# Patient Record
Sex: Female | Born: 1960
Health system: Southern US, Community
[De-identification: ages and names within clinical notes are randomized; demographics above are authoritative.]

## PROBLEM LIST (undated history)

## (undated) DIAGNOSIS — R002 Palpitations: Secondary | ICD-10-CM

## (undated) DIAGNOSIS — H579 Unspecified disorder of eye and adnexa: Secondary | ICD-10-CM

## (undated) DIAGNOSIS — E669 Obesity, unspecified: Secondary | ICD-10-CM

## (undated) DIAGNOSIS — G510 Bell's palsy: Secondary | ICD-10-CM

## (undated) DIAGNOSIS — H3500 Unspecified background retinopathy: Secondary | ICD-10-CM

## (undated) DIAGNOSIS — R531 Weakness: Secondary | ICD-10-CM

## (undated) HISTORY — DX: Obesity, unspecified: E66.9

## (undated) HISTORY — DX: Unspecified disorder of eye and adnexa: H57.9

## (undated) HISTORY — DX: Palpitations: R00.2

## (undated) HISTORY — DX: Unspecified background retinopathy: H35.00

## (undated) HISTORY — DX: Weakness: R53.1

## (undated) HISTORY — PX: GALLBLADDER SURGERY: SHX652

## (undated) HISTORY — DX: Bell's palsy: G51.0

---

## 1994-11-08 HISTORY — PX: ABDOMINAL HYSTERECTOMY: SHX81

## 2001-11-14 ENCOUNTER — Ambulatory Visit (HOSPITAL_COMMUNITY): Admission: RE | Admit: 2001-11-14 | Discharge: 2001-11-14 | Payer: Self-pay | Admitting: *Deleted

## 2001-11-14 ENCOUNTER — Encounter: Payer: Self-pay | Admitting: *Deleted

## 2002-02-14 ENCOUNTER — Encounter: Payer: Self-pay | Admitting: *Deleted

## 2002-02-14 ENCOUNTER — Ambulatory Visit (HOSPITAL_COMMUNITY): Admission: RE | Admit: 2002-02-14 | Discharge: 2002-02-14 | Payer: Self-pay | Admitting: *Deleted

## 2002-05-02 ENCOUNTER — Ambulatory Visit (HOSPITAL_COMMUNITY): Admission: RE | Admit: 2002-05-02 | Discharge: 2002-05-02 | Payer: Self-pay | Admitting: *Deleted

## 2002-05-02 ENCOUNTER — Encounter: Payer: Self-pay | Admitting: *Deleted

## 2002-09-01 ENCOUNTER — Encounter: Payer: Self-pay | Admitting: Family Medicine

## 2002-09-01 ENCOUNTER — Ambulatory Visit (HOSPITAL_COMMUNITY): Admission: RE | Admit: 2002-09-01 | Discharge: 2002-09-01 | Payer: Self-pay | Admitting: Family Medicine

## 2002-09-21 ENCOUNTER — Ambulatory Visit (HOSPITAL_COMMUNITY): Admission: RE | Admit: 2002-09-21 | Discharge: 2002-09-21 | Payer: Self-pay | Admitting: General Surgery

## 2002-10-11 ENCOUNTER — Ambulatory Visit (HOSPITAL_COMMUNITY): Admission: RE | Admit: 2002-10-11 | Discharge: 2002-10-11 | Payer: Self-pay | Admitting: Internal Medicine

## 2003-06-24 ENCOUNTER — Encounter: Payer: Self-pay | Admitting: Family Medicine

## 2003-06-24 ENCOUNTER — Ambulatory Visit (HOSPITAL_COMMUNITY): Admission: RE | Admit: 2003-06-24 | Discharge: 2003-06-24 | Payer: Self-pay | Admitting: Family Medicine

## 2007-02-01 ENCOUNTER — Other Ambulatory Visit: Admission: RE | Admit: 2007-02-01 | Discharge: 2007-02-01 | Payer: Self-pay | Admitting: Obstetrics and Gynecology

## 2007-03-01 ENCOUNTER — Encounter: Admission: RE | Admit: 2007-03-01 | Discharge: 2007-03-01 | Payer: Self-pay | Admitting: Obstetrics and Gynecology

## 2007-06-21 ENCOUNTER — Encounter (HOSPITAL_COMMUNITY): Admission: RE | Admit: 2007-06-21 | Discharge: 2007-07-21 | Payer: Self-pay | Admitting: Internal Medicine

## 2007-07-19 ENCOUNTER — Emergency Department (HOSPITAL_COMMUNITY): Admission: EM | Admit: 2007-07-19 | Discharge: 2007-07-19 | Payer: Self-pay | Admitting: Emergency Medicine

## 2007-09-16 ENCOUNTER — Ambulatory Visit (HOSPITAL_COMMUNITY): Admission: RE | Admit: 2007-09-16 | Discharge: 2007-09-16 | Payer: Self-pay | Admitting: Specialist

## 2008-05-01 ENCOUNTER — Ambulatory Visit (HOSPITAL_COMMUNITY): Admission: RE | Admit: 2008-05-01 | Discharge: 2008-05-01 | Payer: Self-pay | Admitting: Family Medicine

## 2008-05-03 ENCOUNTER — Ambulatory Visit (HOSPITAL_COMMUNITY): Admission: RE | Admit: 2008-05-03 | Discharge: 2008-05-03 | Payer: Self-pay | Admitting: Family Medicine
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
## 2009-01-15 ENCOUNTER — Other Ambulatory Visit: Admission: RE | Admit: 2009-01-15 | Discharge: 2009-01-15 | Payer: Self-pay | Admitting: Interventional Radiology

## 2009-01-15 ENCOUNTER — Encounter (INDEPENDENT_AMBULATORY_CARE_PROVIDER_SITE_OTHER): Payer: Self-pay | Admitting: Interventional Radiology

## 2009-01-15 ENCOUNTER — Encounter: Admission: RE | Admit: 2009-01-15 | Discharge: 2009-01-15 | Payer: Self-pay | Admitting: Endocrinology

## 2009-01-22 ENCOUNTER — Emergency Department (HOSPITAL_COMMUNITY): Admission: EM | Admit: 2009-01-22 | Discharge: 2009-01-22 | Payer: Self-pay | Admitting: Emergency Medicine

## 2009-10-24 ENCOUNTER — Ambulatory Visit (HOSPITAL_COMMUNITY): Admission: RE | Admit: 2009-10-24 | Discharge: 2009-10-24 | Payer: Self-pay | Admitting: Family Medicine

## 2010-02-26 ENCOUNTER — Ambulatory Visit (HOSPITAL_COMMUNITY): Admission: RE | Admit: 2010-02-26 | Discharge: 2010-02-26 | Payer: Self-pay | Admitting: Internal Medicine

## 2010-04-16 ENCOUNTER — Ambulatory Visit (HOSPITAL_COMMUNITY): Admission: RE | Admit: 2010-04-16 | Discharge: 2010-04-16 | Payer: Self-pay | Admitting: Family Medicine

## 2010-07-06 ENCOUNTER — Emergency Department (HOSPITAL_COMMUNITY): Admission: EM | Admit: 2010-07-06 | Discharge: 2010-07-06 | Payer: Self-pay | Admitting: Emergency Medicine

## 2010-10-09 ENCOUNTER — Ambulatory Visit: Payer: Self-pay | Admitting: Cardiology

## 2010-10-09 DIAGNOSIS — E663 Overweight: Secondary | ICD-10-CM | POA: Insufficient documentation

## 2010-10-09 DIAGNOSIS — E785 Hyperlipidemia, unspecified: Secondary | ICD-10-CM | POA: Insufficient documentation

## 2010-10-09 DIAGNOSIS — R9431 Abnormal electrocardiogram [ECG] [EKG]: Secondary | ICD-10-CM | POA: Insufficient documentation

## 2010-11-29 ENCOUNTER — Encounter: Payer: Self-pay | Admitting: Family Medicine

## 2010-11-29 ENCOUNTER — Encounter: Payer: Self-pay | Admitting: Internal Medicine

## 2010-11-30 ENCOUNTER — Encounter: Payer: Self-pay | Admitting: Family Medicine

## 2010-12-08 NOTE — Assessment & Plan Note (Signed)
Summary: **NP6 ABNORMAL EKG   Visit Type:  Initial Consult Primary [facility] [place]:  Dr.Henry Tripp  CC:  abnormal ekg per Dr.Tripp.  History of Present Illness: Kayla Marks is a pleasant 50 year old Hispanic female referred today by Dr. Redmond School for an abnormal EKG.  She has had some sharp stabbing pain at night only over her left breast. It is well localized it does not radiate. There is no nausea, vomiting, or shortness of breath. She has had no exertional symptoms.  She is a diabetic and has been for 3 years. She is overweight. She also has hyperlipidemia and is on lovastatin.  Her EKG from the outside showed low voltage with poor R-wave progression anterior corneum. She's had no previous history of cardiac disease.  She denies orthopnea, PND or edema. She's had no palpitations or syncope. She denies any dyspnea on exertion.  Current Medications (verified): 1)  Metformin Hcl 500 Mg Tabs (Metformin Hcl) .... Take 1 Tab Two Times A Day 2)  Aspir-Low 81 Mg Tbec (Aspirin) .... Take 1 Tab Daily 3)  Lovastatin 10 Mg Tabs (Lovastatin) .... Take 1 Tab At Bedtime  Allergies: 1)  ! * Hmg-Coa Reductase Inhibitors  Comments:  Nurse/Medical Assistant: meds are stated from patient and preloaded fronm referring Docter ov [facility] in [place]  Past History:  Past Medical History: Last updated: 10/07/2010 diabetes palpatations weakness bell palsy  Past Surgical History: Last updated: 10/07/2010 hysterectomy(1996) caesarean section (1990) childbirth times 3  Social History: Last updated: 10/07/2010 Married  Tobacco Use - No.  Alcohol Use - no Regular Exercise - no Drug Use - no  Risk Factors: Exercise: no (10/07/2010)  Risk Factors: Smoking Status: never (10/07/2010)  Review of Systems       negative other than history of present illness  Vital Signs:  Patient profile:   50 year old female Height:      53 inches Weight:      169 pounds BMI:     42.45 O2 Sat:      98  % Pulse rate:   64 / minute BP sitting:   125 / 78  (right arm)  Vitals Entered By: Kayla Saa, CNA (October 09, 2010 10:16 AM)  Physical Exam  General:  obese.  no acute distress Head:  normocephalic and atraumatic Eyes:  PERRLA/EOM intact; conjunctiva and lids normal. Neck:  Neck supple, no JVD. No masses, thyromegaly or abnormal cervical nodes. Chest Wall:  no deformities or breast masses noted. no discrete tenderness Lungs:  Clear bilaterally to auscultation and percussion. Heart:  PMI hard to appreciate. Soft S1-S2 splits. No murmur. No gallop. Carotids full without bruits Abdomen:  obese, soft, good bowel sounds, no tenderness, no obvious organomegaly Msk:  Back normal, normal gait. Muscle strength and tone normal. Pulses:  pulses normal in all 4 extremities Extremities:  No clubbing or cyanosis. Neurologic:  Alert and oriented x 3. Skin:  Intact without lesions or rashes. Psych:  Normal affect.   Problems:  Medical Problems Added: 1)  Dx of Hyperlipidemia-mixed  (ICD-272.4) 2)  Dx of Overweight/obesity  (ICD-278.02) 3)  Dx of Abnormal Ekg  (ICD-794.31)  EKG  Procedure date:  10/09/2010  Findings:      normal sinus rhythm, low voltage, poor R. progression and her precordium  Impression & Recommendations:  Problem # 1:  ABNORMAL EKG (ICD-794.31) The EKG demonstrates low voltage with poor R-wave progression in the anterior precordium. I think this is a normal EKG for her body habitus. She has  a benign history and a normal physical examination. No further cardiac testing warranted at this time. She needs to lose weight and she needs to try to control her sugar and followup on aggressive management of her cholesterol. Advised to continue taking aspirin because of her diabetes. Her updated medication list for this problem includes:    Aspir-low 81 Mg Tbec (Aspirin) .Marland Kitchen... Take 1 tab daily  Problem # 2:  OVERWEIGHT/OBESITY (ICD-278.02)  Problem # 3:   HYPERLIPIDEMIA-MIXED (ICD-272.4)  Her updated medication list for this problem includes:    Lovastatin 10 Mg Tabs (Lovastatin) .Marland Kitchen... Take 1 tab at bedtime  Patient Instructions: 1)  Your physician recommends that you schedule a follow-up appointment in: as needed  2)  Your physician recommends that you continue on your current medications as directed. Please refer to the Current Medication list given to you today.

## 2010-12-08 NOTE — Letter (Signed)
Summary: [facility]   Imported By: Faythe Ghee 10/09/2010 12:29:00  _____________________________________________________________________  External Attachment:    Type:   Image     Comment:   External Document

## 2011-01-22 LAB — DIFFERENTIAL
Basophils Absolute: 0 10*3/uL (ref 0.0–0.1)
Basophils Relative: 0 % (ref 0–1)
Eosinophils Absolute: 0.1 10*3/uL (ref 0.0–0.7)
Eosinophils Relative: 1 % (ref 0–5)
Lymphs Abs: 2.5 10*3/uL (ref 0.7–4.0)
Monocytes Relative: 6 % (ref 3–12)
Neutrophils Relative %: 58 % (ref 43–77)

## 2011-01-22 LAB — CBC
Hemoglobin: 13.9 g/dL (ref 12.0–15.0)
MCH: 31.8 pg (ref 26.0–34.0)
MCHC: 34.5 g/dL (ref 30.0–36.0)
MCV: 92 fL (ref 78.0–100.0)

## 2011-01-22 LAB — BASIC METABOLIC PANEL
CO2: 26 mEq/L (ref 19–32)
Calcium: 8.9 mg/dL (ref 8.4–10.5)
Chloride: 100 mEq/L (ref 96–112)
Creatinine, Ser: 0.67 mg/dL (ref 0.4–1.2)
Glucose, Bld: 483 mg/dL — ABNORMAL HIGH (ref 70–99)
Sodium: 133 mEq/L — ABNORMAL LOW (ref 135–145)

## 2011-01-22 LAB — TROPONIN I: Troponin I: 0.02 ng/mL (ref 0.00–0.06)

## 2011-01-22 LAB — CK TOTAL AND CKMB (NOT AT ARMC)
CK, MB: 1.7 ng/mL (ref 0.3–4.0)
Relative Index: INVALID (ref 0.0–2.5)

## 2011-02-18 LAB — CBC
MCHC: 35.6 g/dL (ref 30.0–36.0)
RDW: 12.2 % (ref 11.5–15.5)

## 2011-02-18 LAB — DIFFERENTIAL
Basophils Absolute: 0 10*3/uL (ref 0.0–0.1)
Basophils Relative: 0 % (ref 0–1)
Neutro Abs: 3.7 10*3/uL (ref 1.7–7.7)
Neutrophils Relative %: 60 % (ref 43–77)

## 2011-02-18 LAB — SEDIMENTATION RATE: Sed Rate: 5 mm/hr (ref 0–22)

## 2011-02-18 LAB — POCT I-STAT, CHEM 8
BUN: 9 mg/dL (ref 6–23)
Chloride: 102 mEq/L (ref 96–112)
Sodium: 137 mEq/L (ref 135–145)

## 2011-05-18 ENCOUNTER — Other Ambulatory Visit (HOSPITAL_COMMUNITY): Payer: Self-pay | Admitting: Family Medicine

## 2011-05-18 DIAGNOSIS — Z139 Encounter for screening, unspecified: Secondary | ICD-10-CM

## 2011-05-25 ENCOUNTER — Ambulatory Visit (HOSPITAL_COMMUNITY)
Admission: RE | Admit: 2011-05-25 | Discharge: 2011-05-25 | Disposition: A | Discharge status: Home / Self Care | Payer: BC Managed Care – PPO | Source: Ambulatory Visit | Attending: Family Medicine | Admitting: Family Medicine

## 2011-05-25 DIAGNOSIS — Z1231 Encounter for screening mammogram for malignant neoplasm of breast: Secondary | ICD-10-CM | POA: Insufficient documentation

## 2011-05-25 DIAGNOSIS — Z139 Encounter for screening, unspecified: Secondary | ICD-10-CM

## 2011-07-29 ENCOUNTER — Other Ambulatory Visit: Payer: Self-pay | Admitting: Geriatric Medicine

## 2011-08-02 ENCOUNTER — Other Ambulatory Visit: Payer: BC Managed Care – PPO

## 2011-08-03 ENCOUNTER — Ambulatory Visit
Admission: RE | Admit: 2011-08-03 | Discharge: 2011-08-03 | Disposition: A | Discharge status: Home / Self Care | Payer: BC Managed Care – PPO | Source: Ambulatory Visit | Attending: Geriatric Medicine | Admitting: Geriatric Medicine

## 2011-10-25 ENCOUNTER — Encounter: Payer: Self-pay | Admitting: Cardiology

## 2011-11-06 ENCOUNTER — Emergency Department (HOSPITAL_COMMUNITY)
Admission: EM | Admit: 2011-11-06 | Discharge: 2011-11-06 | Disposition: A | Discharge status: Home / Self Care | Payer: BC Managed Care – PPO | Attending: Emergency Medicine | Admitting: Emergency Medicine

## 2011-11-06 ENCOUNTER — Encounter (HOSPITAL_COMMUNITY): Payer: Self-pay | Admitting: *Deleted

## 2011-11-06 ENCOUNTER — Other Ambulatory Visit: Payer: Self-pay

## 2011-11-06 ENCOUNTER — Emergency Department (HOSPITAL_COMMUNITY): Payer: BC Managed Care – PPO

## 2011-11-06 DIAGNOSIS — E119 Type 2 diabetes mellitus without complications: Secondary | ICD-10-CM | POA: Insufficient documentation

## 2011-11-06 DIAGNOSIS — Z91199 Patient's noncompliance with other medical treatment and regimen due to unspecified reason: Secondary | ICD-10-CM | POA: Insufficient documentation

## 2011-11-06 DIAGNOSIS — R079 Chest pain, unspecified: Secondary | ICD-10-CM | POA: Insufficient documentation

## 2011-11-06 DIAGNOSIS — Z7982 Long term (current) use of aspirin: Secondary | ICD-10-CM | POA: Insufficient documentation

## 2011-11-06 DIAGNOSIS — Z9119 Patient's noncompliance with other medical treatment and regimen: Secondary | ICD-10-CM | POA: Insufficient documentation

## 2011-11-06 DIAGNOSIS — R51 Headache: Secondary | ICD-10-CM | POA: Insufficient documentation

## 2011-11-06 DIAGNOSIS — Z9079 Acquired absence of other genital organ(s): Secondary | ICD-10-CM | POA: Insufficient documentation

## 2011-11-06 DIAGNOSIS — R739 Hyperglycemia, unspecified: Secondary | ICD-10-CM

## 2011-11-06 DIAGNOSIS — Z136 Encounter for screening for cardiovascular disorders: Secondary | ICD-10-CM | POA: Insufficient documentation

## 2011-11-06 LAB — CBC
HCT: 42.5 % (ref 36.0–46.0)
MCV: 88 fL (ref 78.0–100.0)
RBC: 4.83 MIL/uL (ref 3.87–5.11)
WBC: 7.2 10*3/uL (ref 4.0–10.5)

## 2011-11-06 LAB — GLUCOSE, CAPILLARY: Glucose-Capillary: 256 mg/dL — ABNORMAL HIGH (ref 70–99)

## 2011-11-06 LAB — CARDIAC PANEL(CRET KIN+CKTOT+MB+TROPI)
CK, MB: 2.8 ng/mL (ref 0.3–4.0)
Total CK: 81 U/L (ref 7–177)
Troponin I: <0.3 ng/mL (ref <0.30)

## 2011-11-06 LAB — DIFFERENTIAL
Eosinophils Relative: 1 % (ref 0–5)
Lymphocytes Relative: 33 % (ref 12–46)
Lymphs Abs: 2.3 10*3/uL (ref 0.7–4.0)
Monocytes Absolute: 0.5 10*3/uL (ref 0.1–1.0)

## 2011-11-06 LAB — BASIC METABOLIC PANEL
CO2: 25 mEq/L (ref 19–32)
Calcium: 8.9 mg/dL (ref 8.4–10.5)
Creatinine, Ser: 0.4 mg/dL — ABNORMAL LOW (ref 0.50–1.10)
Glucose, Bld: 286 mg/dL — ABNORMAL HIGH (ref 70–99)

## 2011-11-06 LAB — PROTIME-INR: INR: 1.01 (ref 0.00–1.49)

## 2011-11-06 MED ORDER — ONDANSETRON HCL 4 MG/2ML IJ SOLN
4.0000 mg | Freq: Once | INTRAMUSCULAR | Status: AC
  Administered 2011-11-06: 4 mg via INTRAVENOUS
  Filled 2011-11-06: qty 2

## 2011-11-06 MED ORDER — MORPHINE SULFATE 4 MG/ML IJ SOLN
4.0000 mg | Freq: Once | INTRAMUSCULAR | Status: AC
  Administered 2011-11-06: 4 mg via INTRAVENOUS
  Filled 2011-11-06: qty 1

## 2011-11-06 NOTE — ED Provider Notes (Signed)
History  Scribed for Kayla Octave, MD, the patient was seen in room APA02/APA02. This chart was scribed by Kayla Marks. The patient's care started at 10:17 AM    CSN: [id number]  Arrival date & time 11/06/11  1007   None     Chief Complaint  Patient presents with  . Chest Pain  . Headache     Patient is a 50 y.o. female presenting with headaches. The history is provided by the patient and the EMS personnel.  Headache  Pertinent negatives include no fever, no shortness of breath, no nausea and no vomiting.  Kayla Marks is a 51 y.o. female who presents to the Emergency Department complaining of constant chest pain and a headache that began last night.  EMS personnel states she was at Kayla. [facility] Marks office complaining of chest pressure and shoulder pain and was given asa and nitro which seemed to help with the pain before arriving at the ED.  Pt states that she typically has headaches but this one feels different.  She has h/o diabetes and does not take insulin, she has no h/o heart problems and denies vomiting and nausea.  She is not taking BCP.    Past Medical History  Diagnosis Date  . Diabetes mellitus   . Palpitations   . Weakness   . Bell palsy     Past Surgical History  Procedure Date  . Abdominal hysterectomy 1996  . Cesarean section 1990    History reviewed. No pertinent family history.  History  Substance Use Topics  . Smoking status: Unknown If Ever Smoked  . Smokeless tobacco: Not on file  . Alcohol Use: No    OB History    Grav Para Term Preterm Abortions TAB SAB Ect Mult Living                  Review of Systems  Constitutional: Negative for fever.       10 Systems reviewed and are negative for acute change except as noted in the HPI.  HENT: Negative for congestion.   Eyes: Negative for discharge and redness.  Respiratory: Negative for cough and shortness of breath.   Cardiovascular: Positive for chest pain.  Gastrointestinal: Negative for  nausea, vomiting, abdominal pain and diarrhea.  Musculoskeletal: Negative for back pain.  Skin: Negative for rash.  Neurological: Positive for headaches. Negative for syncope and numbness.  Psychiatric/Behavioral:       No behavior change.    Allergies  Review of patient's allergies indicates no known allergies.  Home Medications   Current Outpatient Rx  Name Route Sig Dispense Refill  . METFORMIN HCL 500 MG PO TABS Oral Take 500 mg by mouth 2 (two) times daily.      . ASPIRIN 81 MG PO TABS Oral Take 81 mg by mouth daily.      Marland Kitchen LOVASTATIN 10 MG PO TABS Oral Take 10 mg by mouth at bedtime.        BP 108/51  Pulse 94  Temp(Src) 97.9 F (36.6 C) (Oral)  Resp 18  Ht 4\' 5"  (1.346 m)  Wt 162 lb (73.483 kg)  BMI 40.55 kg/m2  SpO2 100%  Physical Exam  Nursing note and vitals reviewed. Constitutional: She is oriented to person, place, and time. She appears well-developed and well-nourished.       Awake, alert, nontoxic appearance.  HENT:  Head: Atraumatic.  Eyes: Conjunctivae are normal. Pupils are equal, round, and reactive to light. Right eye exhibits no discharge.  Left eye exhibits no discharge.  Neck: Neck supple.  Pulmonary/Chest: Effort normal. She exhibits no tenderness.  Abdominal: Soft. There is no tenderness. There is no rebound.  Musculoskeletal: Normal range of motion. She exhibits no tenderness.       Baseline ROM, no obvious new focal weakness.  Neurological: She is alert and oriented to person, place, and time.       Mental status and motor strength appears baseline for patient and situation.  Skin: Skin is warm and dry. No rash noted.  Psychiatric: She has a normal mood and affect.    ED Course  Procedures   DIAGNOSTIC STUDIES: Oxygen Saturation is 100% on room air, normal by my interpretation.    COORDINATION OF CARE:  10:18AM Ordered: CBC ; Differential ; Basic metabolic panel ; Protime-INR ; D-dimer, quantitative ; Cardiac panel(cret  kin+cktot+mb+tropi) ; ED EKG ; DG Chest 2 View ; morphine 4 MG/ML injection 4 mg ; ondansetron (ZOFRAN) injection 4 mg, POCT CBG monitoring  12:36PM Discussed with pt the need to be admitted.    Labs Reviewed  BASIC METABOLIC PANEL - Abnormal; Notable for the following:    Glucose, Bld 286 (*)    Creatinine, Ser 0.40 (*)    All other components within normal limits  GLUCOSE, CAPILLARY - Abnormal; Notable for the following:    Glucose-Capillary 256 (*)    All other components within normal limits  CBC  DIFFERENTIAL  PROTIME-INR  D-DIMER, QUANTITATIVE  CARDIAC PANEL(CRET KIN+CKTOT+MB+TROPI)  CARDIAC PANEL(CRET KIN+CKTOT+MB+TROPI)   Dg Chest 2 View  11/06/2011  *RADIOLOGY REPORT*  Clinical Data: Chest pain, cough, and weakness.  CHEST - 2 VIEW  Comparison: 09/16/2007  Findings: There is slight peribronchial thickening with slight accentuation of the interstitial markings consistent with bronchitis.  No consolidative infiltrates or effusions.  Heart size and vascularity are normal.  No acute osseous abnormality.  IMPRESSION: Bronchitic changes.  Original Report Authenticated By: Kayla Marks, M.D.     1. Chest pain   2. Hyperglycemia       MDM  Non compliant diabetic with aching chest pain that has been constant since last night.  Atypical for ACS.  Patient's negative workup discussed with Kayla Marks. He states that she is at fairly noncompliant diabetic and is supposed to be on insulin though she tells me she is not.. Hemoglobin A1c is a chronically elevated. He would like her to come in the hospital to evaluate for hyperglycemia and chest pain as he thinks that she will not followup.  D/w [facility].  Will negative enzymes and EKG, patient appears stable for outpatient followup.  Despite noncompliance, that is not a reason to stress test in the hospital.  Case discussed again with Kayla Marks. He Understand the situation that the hospitalist doesn't want to admit the  patient. He wants it documented that the patient will be going home against her primary care doctor's advice. Asked the hospitalist to call Kayla Marks which they declined. We'll refer patient to Kayla Marks outpatient stress test. Patient has had 2 negative sets of cardiac enzymes and a EKG with no acute ischemia.   Date: 11/06/2011  Rate: 64  Rhythm: normal sinus rhythm  QRS Axis: normal  Intervals: normal  ST/T Wave abnormalities: nonspecific ST/T changes  Conduction Disutrbances:none  Narrative Interpretation: Q waves, V2, V3, lead III, more progressed from previous  Old EKG Reviewed: unchanged  I personally performed the services described in this documentation, which was scribed in my  presence.  The recorded information has been reviewed and considered.   Kayla Octave, MD 11/06/11 [phone]

## 2011-11-06 NOTE — ED Notes (Signed)
Pt was seen today at her pcp d/t cp and headache. pts major complaint today is headache.

## 2012-06-20 ENCOUNTER — Telehealth: Payer: Self-pay | Admitting: *Deleted

## 2012-06-20 NOTE — Telephone Encounter (Signed)
Kayla Marks called to schedule his colonoscopy. Please call him back. Thanks.

## 2012-06-21 NOTE — Telephone Encounter (Signed)
Called and no answer.

## 2012-06-27 NOTE — Telephone Encounter (Signed)
LMOM to call.

## 2012-06-29 NOTE — Telephone Encounter (Signed)
Letter mailed to pt to call.  

## 2014-06-30 ENCOUNTER — Emergency Department (HOSPITAL_COMMUNITY)
Admission: EM | Admit: 2014-06-30 | Discharge: 2014-06-30 | Disposition: A | Discharge status: Home / Self Care | Payer: 59 | Attending: Emergency Medicine | Admitting: Emergency Medicine

## 2014-06-30 ENCOUNTER — Encounter (HOSPITAL_COMMUNITY): Payer: Self-pay | Admitting: Emergency Medicine

## 2014-06-30 DIAGNOSIS — R110 Nausea: Secondary | ICD-10-CM | POA: Insufficient documentation

## 2014-06-30 DIAGNOSIS — R5383 Other fatigue: Secondary | ICD-10-CM | POA: Diagnosis not present

## 2014-06-30 DIAGNOSIS — R51 Headache: Secondary | ICD-10-CM | POA: Diagnosis not present

## 2014-06-30 DIAGNOSIS — R42 Dizziness and giddiness: Secondary | ICD-10-CM | POA: Insufficient documentation

## 2014-06-30 DIAGNOSIS — Z792 Long term (current) use of antibiotics: Secondary | ICD-10-CM | POA: Insufficient documentation

## 2014-06-30 DIAGNOSIS — E119 Type 2 diabetes mellitus without complications: Secondary | ICD-10-CM | POA: Insufficient documentation

## 2014-06-30 DIAGNOSIS — R5381 Other malaise: Secondary | ICD-10-CM | POA: Insufficient documentation

## 2014-06-30 DIAGNOSIS — R509 Fever, unspecified: Secondary | ICD-10-CM | POA: Diagnosis not present

## 2014-06-30 DIAGNOSIS — H5710 Ocular pain, unspecified eye: Secondary | ICD-10-CM | POA: Insufficient documentation

## 2014-06-30 DIAGNOSIS — R519 Headache, unspecified: Secondary | ICD-10-CM

## 2014-06-30 DIAGNOSIS — H9209 Otalgia, unspecified ear: Secondary | ICD-10-CM | POA: Insufficient documentation

## 2014-06-30 DIAGNOSIS — Z79899 Other long term (current) drug therapy: Secondary | ICD-10-CM | POA: Insufficient documentation

## 2014-06-30 LAB — CBC
HCT: 41.4 % (ref 36.0–46.0)
HEMOGLOBIN: 14.4 g/dL (ref 12.0–15.0)
MCH: 30.8 pg (ref 26.0–34.0)
MCHC: 34.8 g/dL (ref 30.0–36.0)
MCV: 88.7 fL (ref 78.0–100.0)
Platelets: 266 10*3/uL (ref 150–400)
RBC: 4.67 MIL/uL (ref 3.87–5.11)
RDW: 11.9 % (ref 11.5–15.5)
WBC: 7.8 10*3/uL (ref 4.0–10.5)

## 2014-06-30 LAB — URINALYSIS, ROUTINE W REFLEX MICROSCOPIC
Bilirubin Urine: NEGATIVE
GLUCOSE, UA: NEGATIVE mg/dL
Hgb urine dipstick: NEGATIVE
KETONES UR: NEGATIVE mg/dL
LEUKOCYTES UA: NEGATIVE
NITRITE: NEGATIVE
PH: 6.5 (ref 5.0–8.0)
Protein, ur: NEGATIVE mg/dL
Specific Gravity, Urine: <1.005 — ABNORMAL LOW (ref 1.005–1.030)
Urobilinogen, UA: 0.2 mg/dL (ref 0.0–1.0)

## 2014-06-30 LAB — BASIC METABOLIC PANEL
Anion gap: 14 (ref 5–15)
BUN: 8 mg/dL (ref 6–23)
CHLORIDE: 101 meq/L (ref 96–112)
CO2: 28 mEq/L (ref 19–32)
Calcium: 9.7 mg/dL (ref 8.4–10.5)
Creatinine, Ser: 0.49 mg/dL — ABNORMAL LOW (ref 0.50–1.10)
GFR calc non Af Amer: >90 mL/min (ref >90)
GLUCOSE: 157 mg/dL — AB (ref 70–99)
POTASSIUM: 3.6 meq/L — AB (ref 3.7–5.3)
SODIUM: 143 meq/L (ref 137–147)

## 2014-06-30 MED ORDER — PROCHLORPERAZINE EDISYLATE 5 MG/ML IJ SOLN
10.0000 mg | Freq: Once | INTRAMUSCULAR | Status: AC
  Administered 2014-06-30: 10 mg via INTRAVENOUS
  Filled 2014-06-30: qty 2

## 2014-06-30 MED ORDER — KETOROLAC TROMETHAMINE 30 MG/ML IJ SOLN
30.0000 mg | Freq: Once | INTRAMUSCULAR | Status: AC
  Administered 2014-06-30: 30 mg via INTRAVENOUS
  Filled 2014-06-30: qty 1

## 2014-06-30 MED ORDER — ONDANSETRON HCL 4 MG/2ML IJ SOLN
4.0000 mg | Freq: Once | INTRAMUSCULAR | Status: AC
  Administered 2014-06-30: 4 mg via INTRAVENOUS
  Filled 2014-06-30: qty 2

## 2014-06-30 MED ORDER — SODIUM CHLORIDE 0.9 % IV BOLUS (SEPSIS)
1000.0000 mL | Freq: Once | INTRAVENOUS | Status: AC
  Administered 2014-06-30: 1000 mL via INTRAVENOUS

## 2014-06-30 MED ORDER — NAPROXEN 500 MG PO TABS: 500.0000 mg | ORAL_TABLET | Freq: Two times a day (BID) | ORAL | Status: DC

## 2014-06-30 NOTE — ED Notes (Signed)
Pt c/o headache, nausea, that started a week ago, pt states that she  started having right earache and sore throat, was seen by pcp and states that the sore throat and earache is better but headache continues.

## 2014-06-30 NOTE — ED Provider Notes (Signed)
CSN: [id number]     Arrival date & time 06/30/14  1757 History  This chart was scribed for Vida Roller, MD by Julian Hy, ED Scribe. The patient was seen in APA07/APA07. The patient's care was started at 6:18 PM.    Chief Complaint  Patient presents with  . Headache   HPI HPI Comments: Kayla Marks is a 53 y.o. female who presents to the Emergency Department complaining of new, constant, gradually worsening fatigue onset 4 days ago. Pt reports associated headache, shakiness, fever, chills, right ear pain and nausea. Pt reports she saw her PCP 3 days ago and had a shot of antibiotics for ear and throat infection. Pt reports she took her Augmentin 2 days ago and started vomiting but denies seeing the pill. Pt reports she is still taking Augmentin with resolved vomiting. Pt denies diarrhea, rash, and sore throat.   Pt has been on Metformin for 2-3 years and they just increased her dose. On Thursday her blood sugars were approximately 300. Pt reports she was given nerve medication but is unable to specify why. Pt reports arthralgias in her left ring finger.   Pt also reports chest pain onset one month ago. Pt describes the pain as "pricking". Non-exertional. Worse when she goes to sleep.   Pt reports right eye pain onset 1-2 months ago.  Past Medical History  Diagnosis Date  . Diabetes mellitus   . Palpitations   . Weakness   . Bell palsy    Past Surgical History  Procedure Laterality Date  . Abdominal hysterectomy  1996  . Cesarean section  1990   No family history on file. History  Substance Use Topics  . Smoking status: Unknown If Ever Smoked  . Smokeless tobacco: Not on file  . Alcohol Use: No   OB History   Grav Para Term Preterm Abortions TAB SAB Ect Mult Living                 Review of Systems  Constitutional: Positive for fever, chills and fatigue.  HENT: Positive for ear pain. Negative for sore throat.   Eyes: Positive for pain.  Gastrointestinal: Positive for  nausea. Negative for vomiting and diarrhea.  Musculoskeletal: Positive for arthralgias.  Skin: Negative for rash.  Neurological: Positive for light-headedness and headaches (frontal).  All other systems reviewed and are negative.     Allergies  Review of patient's allergies indicates no known allergies.  Home Medications   Prior to Admission medications   Medication Sig Start Date End Date Taking? Authorizing Provider  amoxicillin-clavulanate (AUGMENTIN) 875-125 MG per tablet Take 1 tablet by mouth 2 (two) times daily. 10 day course starting on 06/29/2014   Yes Historical Provider, MD  gabapentin (NEURONTIN) 800 MG tablet Take 800 mg by mouth at bedtime.   Yes Historical Provider, MD  metFORMIN (GLUCOPHAGE) 850 MG tablet Take 850 mg by mouth 3 (three) times daily.   Yes Historical Provider, MD  naproxen (NAPROSYN) 500 MG tablet Take 1 tablet (500 mg total) by mouth 2 (two) times daily with a meal. 06/30/14   Vida Roller, MD   Triage Vitals: BP 135/83  Pulse 83  Temp(Src) 97.9 F (36.6 C) (Oral)  Resp 14  Ht  (1.27 m)  Wt 162 lb (73.483 kg)  BMI 45.56 kg/m2  SpO2 97% Physical Exam  Nursing note and vitals reviewed. Constitutional: She appears well-developed and well-nourished. No distress.  HENT:  Head: Atraumatic. Macrocephalic.  Right Ear: Tympanic membrane  normal.  Left Ear: Tympanic membrane normal.  Nose: Nose normal.  Mouth/Throat: Oropharynx is clear and moist. No oropharyngeal exudate.  TMs are clear. Nares are clear. Posterior pharynx with small amount of posterior drainage.   Eyes: Conjunctivae and EOM are normal. Pupils are equal, round, and reactive to light. Right eye exhibits no discharge. Left eye exhibits no discharge. No scleral icterus.  Neck: Normal range of motion. Neck supple. No JVD present. No thyromegaly present.  Cardiovascular: Normal rate, regular rhythm, normal heart sounds and intact distal pulses.  Exam reveals no gallop and no friction  rub.   No murmur heard. Pulmonary/Chest: Effort normal and breath sounds normal. No respiratory distress. She has no wheezes. She has no rales.  Abdominal: Soft. Bowel sounds are normal. She exhibits no distension and no mass. There is no tenderness.  Musculoskeletal: Normal range of motion. She exhibits no edema and no tenderness.  Lymphadenopathy:    She has no cervical adenopathy.  Neurological: She is alert. Coordination normal.  Skin: Skin is warm and dry. No rash noted. No erythema.  Psychiatric: She has a normal mood and affect. Her behavior is normal.    ED Course  Procedures (including critical care time) DIAGNOSTIC STUDIES: Oxygen Saturation is 97% on RA, normal by my interpretation.    COORDINATION OF CARE: 6:26 PM- Patient informed of current plan for treatment and evaluation and agrees with plan at this time.    Labs Review Labs Reviewed  BASIC METABOLIC PANEL - Abnormal; Notable for the following:    Potassium 3.6 (*)    Glucose, Bld 157 (*)    Creatinine, Ser 0.49 (*)    All other components within normal limits  URINALYSIS, ROUTINE W REFLEX MICROSCOPIC - Abnormal; Notable for the following:    Specific Gravity, Urine <1.005 (*)    All other components within normal limits  CBC  TSH    Imaging Review No results found.    MDM   Final diagnoses:  Acute nonintractable headache, unspecified headache type  Other fatigue    Labs are unremarkable, the patient has a normal neurologic exam at this time the patient appears very stable. She has improved significantly and is pain free with the medications as below and is amenable to followup.   Meds given in ED:  Medications  ondansetron (ZOFRAN) injection 4 mg (4 mg Intravenous Given 06/30/14 1849)  sodium chloride 0.9 % bolus 1,000 mL (0 mLs Intravenous Stopped 06/30/14 2016)  ketorolac (TORADOL) 30 MG/ML injection 30 mg (30 mg Intravenous Given 06/30/14 2013)  prochlorperazine (COMPAZINE) injection 10 mg  (10 mg Intravenous Given 06/30/14 2013)    Discharge Medication List as of 06/30/2014 11:00 PM    START taking these medications   Details  naproxen (NAPROSYN) 500 MG tablet Take 1 tablet (500 mg total) by mouth 2 (two) times daily with a meal., Starting 06/30/2014, Until Discontinued, Print          I personally performed the services described in this documentation, which was scribed in my presence. The recorded information has been reviewed and is accurate.      Vida Roller, MD 07/01/14 [phone]

## 2014-06-30 NOTE — Discharge Instructions (Signed)
Headache: ° °You are having a headache. No specific cause was found today for your headache. It may have been a migraine or other cause of headache. Stress, anxiety, fatigue, and depression are common triggers for headaches. Your headache today does not appear to be life-threatening or require hospitalization, but often the exact cause of headaches is not determined in the emergency department. Therefore, followup with your doctor is very important to find out what may have caused your headache, and whether or not you need any further diagnostic testing or treatment. Sometimes headaches can appear benign but then more serious symptoms can develop which should prompt an immediate reevaluation by your doctor or the emergency department. ° °Seek immediate medical attention if: ° °· You develop possible problems with medications prescribed. °· The medications don't resolve your headache, if it recurs, or if you have multiple episodes of vomiting or can't take fluids by mouth °· You have a change from the usual headache. °· If you developed a sudden severe headache or confusion, become poorly responsive or faint, developed a fever above 100.4 or problems breathing, have a change in speech, vision, swallowing or understanding, or developed new weakness, numbness, tingling, incoordination or have a seizure. ° °If you don't have a family doctor to follow up with, see the follow up list below - call this morning for a follow-up appointment in the next 1-2 days. ° °

## 2014-07-01 LAB — TSH: TSH: 1.5 u[IU]/mL (ref 0.350–4.500)

## 2014-10-08 ENCOUNTER — Encounter (HOSPITAL_COMMUNITY): Payer: Self-pay | Admitting: Emergency Medicine

## 2014-10-08 ENCOUNTER — Emergency Department (HOSPITAL_COMMUNITY)
Admission: EM | Admit: 2014-10-08 | Discharge: 2014-10-08 | Disposition: A | Discharge status: Home / Self Care | Payer: 59 | Attending: Emergency Medicine | Admitting: Emergency Medicine

## 2014-10-08 ENCOUNTER — Emergency Department (HOSPITAL_COMMUNITY): Payer: 59

## 2014-10-08 DIAGNOSIS — G43809 Other migraine, not intractable, without status migrainosus: Secondary | ICD-10-CM

## 2014-10-08 DIAGNOSIS — R42 Dizziness and giddiness: Secondary | ICD-10-CM

## 2014-10-08 DIAGNOSIS — E119 Type 2 diabetes mellitus without complications: Secondary | ICD-10-CM | POA: Diagnosis not present

## 2014-10-08 DIAGNOSIS — R200 Anesthesia of skin: Secondary | ICD-10-CM

## 2014-10-08 DIAGNOSIS — H538 Other visual disturbances: Secondary | ICD-10-CM | POA: Diagnosis present

## 2014-10-08 DIAGNOSIS — Z79899 Other long term (current) drug therapy: Secondary | ICD-10-CM | POA: Diagnosis not present

## 2014-10-08 LAB — CBC WITH DIFFERENTIAL/PLATELET
BASOS ABS: 0 10*3/uL (ref 0.0–0.1)
Basophils Relative: 0 % (ref 0–1)
Eosinophils Absolute: 0.1 10*3/uL (ref 0.0–0.7)
Eosinophils Relative: 1 % (ref 0–5)
HEMATOCRIT: 39.6 % (ref 36.0–46.0)
Hemoglobin: 13.9 g/dL (ref 12.0–15.0)
LYMPHS ABS: 3 10*3/uL (ref 0.7–4.0)
LYMPHS PCT: 38 % (ref 12–46)
MCH: 31.4 pg (ref 26.0–34.0)
MCHC: 35.1 g/dL (ref 30.0–36.0)
MCV: 89.6 fL (ref 78.0–100.0)
MONO ABS: 0.5 10*3/uL (ref 0.1–1.0)
Monocytes Relative: 6 % (ref 3–12)
Neutro Abs: 4.4 10*3/uL (ref 1.7–7.7)
Neutrophils Relative %: 55 % (ref 43–77)
Platelets: 274 10*3/uL (ref 150–400)
RBC: 4.42 MIL/uL (ref 3.87–5.11)
RDW: 12.6 % (ref 11.5–15.5)
WBC: 8 10*3/uL (ref 4.0–10.5)

## 2014-10-08 LAB — BASIC METABOLIC PANEL
ANION GAP: 12 (ref 5–15)
BUN: 11 mg/dL (ref 6–23)
CALCIUM: 9.2 mg/dL (ref 8.4–10.5)
CHLORIDE: 99 meq/L (ref 96–112)
CO2: 27 meq/L (ref 19–32)
CREATININE: 0.44 mg/dL — AB (ref 0.50–1.10)
GFR calc Af Amer: >90 mL/min (ref >90)
GFR calc non Af Amer: >90 mL/min (ref >90)
GLUCOSE: 174 mg/dL — AB (ref 70–99)
Potassium: 3.6 mEq/L — ABNORMAL LOW (ref 3.7–5.3)
Sodium: 138 mEq/L (ref 137–147)

## 2014-10-08 LAB — CBG MONITORING, ED: Glucose-Capillary: 177 mg/dL — ABNORMAL HIGH (ref 70–99)

## 2014-10-08 MED ORDER — METOCLOPRAMIDE HCL 5 MG/ML IJ SOLN
10.0000 mg | Freq: Once | INTRAMUSCULAR | Status: AC
  Administered 2014-10-08: 10 mg via INTRAVENOUS
  Filled 2014-10-08: qty 2

## 2014-10-08 MED ORDER — KETOROLAC TROMETHAMINE 30 MG/ML IJ SOLN
30.0000 mg | Freq: Once | INTRAMUSCULAR | Status: AC
  Administered 2014-10-08: 30 mg via INTRAVENOUS
  Filled 2014-10-08: qty 1

## 2014-10-08 MED ORDER — MECLIZINE HCL 12.5 MG PO TABS
25.0000 mg | ORAL_TABLET | Freq: Once | ORAL | Status: AC
  Administered 2014-10-08: 25 mg via ORAL
  Filled 2014-10-08: qty 2

## 2014-10-08 MED ORDER — MECLIZINE HCL 25 MG PO TABS: 25.0000 mg | ORAL_TABLET | Freq: Three times a day (TID) | ORAL | Status: DC | PRN

## 2014-10-08 MED ORDER — NAPROXEN 500 MG PO TABS: 500.0000 mg | ORAL_TABLET | Freq: Two times a day (BID) | ORAL | Status: DC

## 2014-10-08 NOTE — ED Notes (Signed)
Pt states she has had double vision starting Friday. She finds herself becoming dizzy. Pt has 20/20 vision in both eyes. Pt denies any pain. Denies nausea. PERRLA. NAD noted at this time.

## 2014-10-08 NOTE — ED Provider Notes (Signed)
CSN: [id number]     Arrival date & time 10/08/14  1245 History  This chart was scribed for Rolland PorterMark Shyanna Klingel, MD by Annye AsaAnna Dorsett, ED Scribe. This patient was seen in room APA17/APA17 and the patient's care was started at 2:01 PM.    Chief Complaint  Patient presents with  . Blurred Vision   The history is provided by the patient. No language interpreter was used.     HPI Comments: Marcellina MillinSinai D Tull is a 53 y.o. female with past medical history of Bell's palsy (4-5 years PTA) who presents to the Emergency Department complaining of 5 days of dizziness (described as lightheadedness, faintness, worsening with sitting up) and blurred, doubled vision (right eye more so than left). Since symptom onset, vision is always blurred and double. She also reports pain to the right side of the head and face.  She denies hearing changes. She believes her experiences at this time feel differently from previous experience with Bell's palsy.   She reports that for the past four months, she feels like "something has been in her eyes" - a painful twitch behind her eye, worsening when falling asleep.   Patient has DM, managed with metformin. She reportedly manages her DM well.    Past Medical History  Diagnosis Date  . Diabetes mellitus   . Palpitations   . Weakness   . Bell palsy    Past Surgical History  Procedure Laterality Date  . Abdominal hysterectomy  1996  . Cesarean section  1990   History reviewed. No pertinent family history. History  Substance Use Topics  . Smoking status: Unknown If Ever Smoked  . Smokeless tobacco: Not on file  . Alcohol Use: No   OB History    No data available     Review of Systems  Constitutional: Negative for fever, chills, diaphoresis, appetite change and fatigue.  HENT: Negative for mouth sores, sore throat and trouble swallowing.   Eyes: Positive for visual disturbance.  Respiratory: Negative for cough, chest tightness, shortness of breath and wheezing.    Cardiovascular: Negative for chest pain.  Gastrointestinal: Negative for nausea, vomiting, abdominal pain, diarrhea and abdominal distention.  Endocrine: Negative for polydipsia, polyphagia and polyuria.  Genitourinary: Negative for dysuria, frequency and hematuria.  Musculoskeletal: Negative for gait problem.  Skin: Negative for color change, pallor and rash.  Neurological: Positive for dizziness, light-headedness and headaches (Right-sided). Negative for syncope.  Hematological: Does not bruise/bleed easily.  Psychiatric/Behavioral: Negative for behavioral problems and confusion.      Allergies  Review of patient's allergies indicates no known allergies.  Home Medications   Prior to Admission medications   Medication Sig Start Date End Date Taking? Authorizing Provider  metFORMIN (GLUCOPHAGE) 850 MG tablet Take 850 mg by mouth 3 (three) times daily.   Yes Historical Provider, MD  meclizine (ANTIVERT) 25 MG tablet Take 1 tablet (25 mg total) by mouth 3 (three) times daily as needed for dizziness. 10/08/14   Rolland PorterMark Zoeya Gramajo, MD  naproxen (NAPROSYN) 500 MG tablet Take 1 tablet (500 mg total) by mouth 2 (two) times daily with a meal. Patient not taking: Reported on 10/08/2014 06/30/14   Vida RollerBrian D Miller, MD  naproxen (NAPROSYN) 500 MG tablet Take 1 tablet (500 mg total) by mouth 2 (two) times daily. 10/08/14   Rolland PorterMark Rusti Arizmendi, MD   BP 121/69 mmHg  Pulse 66  Temp(Src) 98.3 F (36.8 C) (Oral)  Resp 18  Ht 4\' 10"  (1.473 m)  Wt 153 lb (69.4 kg)  BMI 31.99 kg/m2  SpO2 99% Physical Exam  Constitutional: She is oriented to person, place, and time. She appears well-developed and well-nourished. No distress.  HENT:  Head: Normocephalic.  Eyes: Conjunctivae and EOM are normal. Pupils are equal, round, and reactive to light. No scleral icterus.  Neck: Normal range of motion. Neck supple. No thyromegaly present.  Cardiovascular: Normal rate and regular rhythm.  Exam reveals no gallop and no friction  rub.   No murmur heard. Pulmonary/Chest: Effort normal and breath sounds normal. No respiratory distress. She has no wheezes. She has no rales.  Abdominal: Soft. Bowel sounds are normal. She exhibits no distension. There is no tenderness. There is no rebound.  Musculoskeletal: Normal range of motion.  Neurological: She is alert and oriented to person, place, and time. No cranial nerve deficit.  Cranial nerves intact but patient reports decreased sensation to entire right side of face Normal finger to nose  Skin: Skin is warm and dry. No rash noted.  Psychiatric: She has a normal mood and affect. Her behavior is normal.  Nursing note and vitals reviewed.   ED Course  Procedures   DIAGNOSTIC STUDIES: Oxygen Saturation is 100% on RA, normal by my interpretation.    COORDINATION OF CARE: 2:12 PM Discussed treatment plan with pt at bedside and pt agreed to plan.   Labs Review Labs Reviewed  BASIC METABOLIC PANEL - Abnormal; Notable for the following:    Potassium 3.6 (*)    Glucose, Bld 174 (*)    Creatinine, Ser 0.44 (*)    All other components within normal limits  CBG MONITORING, ED - Abnormal; Notable for the following:    Glucose-Capillary 177 (*)    All other components within normal limits  CULTURE, BLOOD (ROUTINE X 2)  CULTURE, BLOOD (ROUTINE X 2)  CBC WITH DIFFERENTIAL    Imaging Review Mr Brain Wo Contrast  10/08/2014   CLINICAL DATA:  Right facial numbness.  Double vision.  EXAM: MRI HEAD WITHOUT CONTRAST  TECHNIQUE: Multiplanar, multiecho pulse sequences of the brain and surrounding structures were obtained without intravenous contrast.  COMPARISON:  CT head 07/06/2010  FINDINGS: Ventricle size is normal. Cerebral volume is normal. Pituitary normal in size.  Negative for acute infarct.  Scattered small white matter hyperintensities in the frontal white matter bilaterally. These are in the deep white matter. Periventricular white matter intact. Brainstem and cerebellum  normal  Negative for hemorrhage or fluid collection.  Negative for mass or edema.  IMPRESSION: No acute abnormality.  Small frontal white matter hyperintensities bilaterally. This may be related to chronic microvascular ischemia or migraine headache. Pattern is not typical for demyelinating disease.   Electronically Signed   By: Marlan Palau M.D.   On: 10/08/2014 16:19     EKG Interpretation None      MDM   Final diagnoses:  Rt facial numbness  Vertigo  Other migraine without status migrainosus, not intractable    Patient's facial numbness has resolved. Her right eye blurred vision has not. She has a normal funduscopic exam. She has normal external eye and adnexa exam. CT scan does not show strokes. No occipital abnormalities. I discussed with her the site lap normality is suggestive of chronic migraines. Referred her to neurology, and ophthalmology for follow-up appointments. Naproxen as needed for headache, Antivert as needed for vertigo.  I personally performed the services described in this documentation, which was scribed in my presence. The recorded information has been reviewed and is accurate.  Rolland PorterMark Omarr Hann, MD 10/08/14 [phone]

## 2014-10-08 NOTE — Discharge Instructions (Signed)
Call the neurologist, and ophthalmologist on-call above for follow-up appointments.  Blurred Vision You have been seen today complaining of blurred vision. This means you have a loss of ability to see small details.  CAUSES  Blurred vision can be a symptom of underlying eye problems, such as:  Aging of the eye (presbyopia).  Glaucoma.  Cataracts.  Eye infection.  Eye-related migraine.  Diabetes mellitus.  Fatigue.  Migraine headaches.  High blood pressure.  Breakdown of the back of the eye (macular degeneration).  Problems caused by some medications. The most common cause of blurred vision is the need for eyeglasses or a new prescription. Today in the emergency department, no cause for your blurred vision can be found. SYMPTOMS  Blurred vision is the loss of visual sharpness and detail (acuity). DIAGNOSIS  Should blurred vision continue, you should see your caregiver. If your caregiver is your primary care physician, he or she may choose to refer you to another specialist.  TREATMENT  Do not ignore your blurred vision. Make sure to have it checked out to see if further treatment or referral is necessary. SEEK MEDICAL CARE IF:  You are unable to get into a specialist so we can help you with a referral. SEEK IMMEDIATE MEDICAL CARE IF: You have severe eye pain, severe headache, or sudden loss of vision. MAKE SURE YOU:   Understand these instructions.  Will watch your condition.  Will get help right away if you are not doing well or get worse. Document Released: 10/28/2003 Document Revised: 01/17/2012 Document Reviewed: 05/29/2008 [facility] Patient Information 2015 [place], [place]. This information is not intended to replace advice given to you by your health care provider. Make sure you discuss any questions you have with your health care provider.  Migraine Headache A migraine headache is very bad, throbbing pain on one or both sides of your head. Talk to your doctor  about what things may bring on (trigger) your migraine headaches. HOME CARE  Only take medicines as told by your doctor.  Lie down in a dark, quiet room when you have a migraine.  Keep a journal to find out if certain things bring on migraine headaches. For example, write down:  What you eat and drink.  How much sleep you get.  Any change to your diet or medicines.  Lessen how much alcohol you drink.  Quit smoking if you smoke.  Get enough sleep.  Lessen any stress in your life.  Keep lights dim if bright lights bother you or make your migraines worse. GET HELP RIGHT AWAY IF:   Your migraine becomes really bad.  You have a fever.  You have a stiff neck.  You have trouble seeing.  Your muscles are weak, or you lose muscle control.  You lose your balance or have trouble walking.  You feel like you will pass out (faint), or you pass out.  You have really bad symptoms that are different than your first symptoms. MAKE SURE YOU:   Understand these instructions.  Will watch your condition.  Will get help right away if you are not doing well or get worse. Document Released: 08/03/2008 Document Revised: 01/17/2012 Document Reviewed: 07/02/2013 [facility] Patient Information 2015 [place], [place]. This information is not intended to replace advice given to you by your health care provider. Make sure you discuss any questions you have with your health care provider.  Vertigo Vertigo means you feel like you are moving when you are not. Vertigo can make you feel like  things around you are moving when they are not. This problem often goes away on its own.  HOME CARE   Follow your doctor's instructions.  Avoid driving.  Avoid using heavy machinery.  Avoid doing any activity that could be dangerous if you have a vertigo attack.  Tell your doctor if a medicine seems to cause your vertigo. GET HELP RIGHT AWAY IF:   Your medicines do not help or make you feel worse.  You  have trouble talking or walking.  You feel weak or have trouble using your arms, hands, or legs.  You have bad headaches.  You keep feeling sick to your stomach (nauseous) or throwing up (vomiting).  Your vision changes.  A family member notices changes in your behavior.  Your problems get worse. MAKE SURE YOU:  Understand these instructions.  Will watch your condition.  Will get help right away if you are not doing well or get worse. Document Released: 08/03/2008 Document Revised: 01/17/2012 Document Reviewed: 05/13/2011 [facility] Patient Information 2015 [place], [place]. This information is not intended to replace advice given to you by your health care provider. Make sure you discuss any questions you have with your health care provider.  Recurrent Migraine Headache A migraine headache is an intense, throbbing pain on one or both sides of your head. Recurrent migraines keep coming back. A migraine can last for 30 minutes to several hours. CAUSES  The exact cause of a migraine headache is not always known. However, a migraine may be caused when nerves in the brain become irritated and release chemicals that cause inflammation. This causes pain. Certain things may also trigger migraines, such as:   Alcohol.  Smoking.  Stress.  Menstruation.  Aged cheeses.  Foods or drinks that contain nitrates, glutamate, aspartame, or tyramine.  Lack of sleep.  Chocolate.  Caffeine.  Hunger.  Physical exertion.  Fatigue.  Medicines used to treat chest pain (nitroglycerine), birth control pills, estrogen, and some blood pressure medicines. SYMPTOMS   Pain on one or both sides of your head.  Pulsating or throbbing pain.  Severe pain that prevents daily activities.  Pain that is aggravated by any physical activity.  Nausea, vomiting, or both.  Dizziness.  Pain with exposure to bright lights, loud noises, or activity.  General sensitivity to bright lights, loud  noises, or smells. Before you get a migraine, you may get warning signs that a migraine is coming (aura). An aura may include:  Seeing flashing lights.  Seeing bright spots, halos, or zigzag lines.  Having tunnel vision or blurred vision.  Having feelings of numbness or tingling.  Having trouble talking.  Having muscle weakness. DIAGNOSIS  A recurrent migraine headache is often diagnosed based on:  Symptoms.  Physical examination.  A CT scan or MRI of your head. These imaging tests cannot diagnose migraines but can help rule out other causes of headaches.  TREATMENT  Medicines may be given for pain and nausea. Medicines can also be given to help prevent recurrent migraines. HOME CARE INSTRUCTIONS  Only take over-the-counter or prescription medicines for pain or discomfort as directed by your health care provider. The use of long-term narcotics is not recommended.  Lie down in a dark, quiet room when you have a migraine.  Keep a journal to find out what may trigger your migraine headaches. For example, write down:  What you eat and drink.  How much sleep you get.  Any change to your diet or medicines.  Limit alcohol consumption.  Quit smoking if you smoke.  Get 7-9 hours of sleep, or as recommended by your health care provider.  Limit stress.  Keep lights dim if bright lights bother you and make your migraines worse. SEEK MEDICAL CARE IF:   You do not get relief from the medicines given to you.  You have a recurrence of pain.  You have a fever. SEEK IMMEDIATE MEDICAL CARE IF:  Your migraine becomes severe.  You have a stiff neck.  You have loss of vision.  You have muscular weakness or loss of muscle control.  You start losing your balance or have trouble walking.  You feel faint or pass out.  You have severe symptoms that are different from your first symptoms. MAKE SURE YOU:   Understand these instructions.  Will watch your condition.  Will  get help right away if you are not doing well or get worse. Document Released: 07/20/2001 Document Revised: 03/11/2014 Document Reviewed: 07/02/2013 [facility] Patient Information 2015 [place], [place]. This information is not intended to replace advice given to you by your health care provider. Make sure you discuss any questions you have with your health care provider.  Vertigo Vertigo means you feel like you or your surroundings are moving when they are not. Vertigo can be dangerous if it occurs when you are at work, driving, or performing difficult activities.  CAUSES  Vertigo occurs when there is a conflict of signals sent to your brain from the visual and sensory systems in your body. There are many different causes of vertigo, including:  Infections, especially in the inner ear.  A bad reaction to a drug or misuse of alcohol and medicines.  Withdrawal from drugs or alcohol.  Rapidly changing positions, such as lying down or rolling over in bed.  A migraine headache.  Decreased blood flow to the brain.  Increased pressure in the brain from a head injury, infection, tumor, or bleeding. SYMPTOMS  You may feel as though the world is spinning around or you are falling to the ground. Because your balance is upset, vertigo can cause nausea and vomiting. You may have involuntary eye movements (nystagmus). DIAGNOSIS  Vertigo is usually diagnosed by physical exam. If the cause of your vertigo is unknown, your caregiver may perform imaging tests, such as an MRI scan (magnetic resonance imaging). TREATMENT  Most cases of vertigo resolve on their own, without treatment. Depending on the cause, your caregiver may prescribe certain medicines. If your vertigo is related to body position issues, your caregiver may recommend movements or procedures to correct the problem. In rare cases, if your vertigo is caused by certain inner ear problems, you may need surgery. HOME CARE INSTRUCTIONS   Follow your  caregiver's instructions.  Avoid driving.  Avoid operating heavy machinery.  Avoid performing any tasks that would be dangerous to you or others during a vertigo episode.  Tell your caregiver if you notice that certain medicines seem to be causing your vertigo. Some of the medicines used to treat vertigo episodes can actually make them worse in some people. SEEK IMMEDIATE MEDICAL CARE IF:   Your medicines do not relieve your vertigo or are making it worse.  You develop problems with talking, walking, weakness, or using your arms, hands, or legs.  You develop severe headaches.  Your nausea or vomiting continues or gets worse.  You develop visual changes.  A family member notices behavioral changes.  Your condition gets worse. MAKE SURE YOU:  Understand these instructions.  Will watch  your condition.  Will get help right away if you are not doing well or get worse. Document Released: 08/04/2005 Document Revised: 01/17/2012 Document Reviewed: 05/13/2011 [facility] Patient Information 2015 [place], [place]. This information is not intended to replace advice given to you by your health care provider. Make sure you discuss any questions you have with your health care provider.  Recurrent Migraine Headache A migraine headache is very bad, throbbing pain on one or both sides of your head. Recurrent migraines keep coming back. Talk to your doctor about what things may bring on (trigger) your migraine headaches. HOME CARE  Only take medicines as told by your doctor.  Lie down in a dark, quiet room when you have a migraine.  Keep a journal to find out if certain things bring on migraine headaches. For example, write down:  What you eat and drink.  How much sleep you get.  Any change to your diet or medicines.  Lessen how much alcohol you drink.  Quit smoking if you smoke.  Get enough sleep.  Lessen any stress in your life.  Keep lights dim if bright lights bother you or  make your migraines worse. GET HELP IF:  Medicine does not help your migraines.  Your pain keeps coming back.  You have a fever. GET HELP RIGHT AWAY IF:   Your migraine becomes really bad.  You have a stiff neck.  You have trouble seeing.  Your muscles are weak, or you lose muscle control.  You lose your balance or have trouble walking.  You feel like you will pass out (faint), or you pass out.  You have really bad symptoms that are different than your first symptoms. MAKE SURE YOU:   Understand these instructions.  Will watch your condition.  Will get help right away if you are not doing well or get worse. Document Released: 08/03/2008 Document Revised: 10/30/2013 Document Reviewed: 07/02/2013 [facility] Patient Information 2015 [place], [place]. This information is not intended to replace advice given to you by your health care provider. Make sure you discuss any questions you have with your health care provider.

## 2014-10-08 NOTE — ED Notes (Signed)
Pt reports blurred vision and headache since Friday. Pt reports history of same with no known cause. nad noted. Speech clear. Facial symmetry. Airway patent.

## 2014-10-08 NOTE — ED Notes (Signed)
MD at bedside. 

## 2014-10-13 LAB — CULTURE, BLOOD (ROUTINE X 2)
CULTURE: NO GROWTH
Culture: NO GROWTH

## 2016-04-02 DIAGNOSIS — J069 Acute upper respiratory infection, unspecified: Secondary | ICD-10-CM | POA: Diagnosis not present

## 2016-04-02 DIAGNOSIS — J029 Acute pharyngitis, unspecified: Secondary | ICD-10-CM | POA: Diagnosis not present

## 2016-04-02 DIAGNOSIS — J329 Chronic sinusitis, unspecified: Secondary | ICD-10-CM | POA: Diagnosis not present

## 2016-07-23 DIAGNOSIS — E1139 Type 2 diabetes mellitus with other diabetic ophthalmic complication: Secondary | ICD-10-CM | POA: Diagnosis not present

## 2016-07-23 DIAGNOSIS — E6609 Other obesity due to excess calories: Secondary | ICD-10-CM | POA: Diagnosis not present

## 2016-07-27 ENCOUNTER — Other Ambulatory Visit (HOSPITAL_COMMUNITY): Payer: Self-pay | Admitting: Internal Medicine

## 2016-07-27 DIAGNOSIS — Z1231 Encounter for screening mammogram for malignant neoplasm of breast: Secondary | ICD-10-CM

## 2016-08-02 ENCOUNTER — Ambulatory Visit (HOSPITAL_COMMUNITY): Payer: Self-pay

## 2016-08-11 ENCOUNTER — Encounter: Payer: Self-pay | Admitting: Advanced Practice Midwife

## 2016-08-11 ENCOUNTER — Telehealth: Payer: Self-pay

## 2016-08-11 ENCOUNTER — Encounter: Payer: Self-pay | Admitting: *Deleted

## 2016-08-11 ENCOUNTER — Ambulatory Visit (INDEPENDENT_AMBULATORY_CARE_PROVIDER_SITE_OTHER): Payer: BLUE CROSS/BLUE SHIELD | Admitting: Advanced Practice Midwife

## 2016-08-11 VITALS — BP 108/60 | HR 70 | Ht <= 58 in | Wt 150.0 lb

## 2016-08-11 DIAGNOSIS — Z0000 Encounter for general adult medical examination without abnormal findings: Secondary | ICD-10-CM | POA: Diagnosis not present

## 2016-08-11 DIAGNOSIS — R1032 Left lower quadrant pain: Secondary | ICD-10-CM

## 2016-08-11 DIAGNOSIS — R102 Pelvic and perineal pain: Secondary | ICD-10-CM

## 2016-08-11 DIAGNOSIS — R1031 Right lower quadrant pain: Secondary | ICD-10-CM

## 2016-08-11 DIAGNOSIS — Z01419 Encounter for gynecological examination (general) (routine) without abnormal findings: Secondary | ICD-10-CM

## 2016-08-11 LAB — POCT URINALYSIS DIPSTICK
Blood, UA: NEGATIVE
Ketones, UA: NEGATIVE
Leukocytes, UA: NEGATIVE
NITRITE UA: NEGATIVE
Protein, UA: NEGATIVE

## 2016-08-11 NOTE — Telephone Encounter (Signed)
Pt received letter about her TCS. Please call her [phone]-Daughter

## 2016-08-11 NOTE — Patient Instructions (Signed)
Mammogram [phone] to schedule 

## 2016-08-11 NOTE — Progress Notes (Signed)
[facility] Visit  Patient name: Kayla Marks MRN [id number]  Date of birth: 10-Sep-1961  CC & HPI:  Kayla Marks is a 55 y.o. Hispanic female presenting today for bilateral pelvic pain.  She has had daily pain for 2-3 months.  Pain lasts 5-10 minutes at a time, sometimes on right, sometimes on left, and sometimes bilateral.  Had hysterectomy 18 years ago, but still has ovaries. Denies bowel problems, urinary problems, GI problems.      Pertinent History Reviewed:  Medical & Surgical Hx:   Past Medical History:  Diagnosis Date  . Bell palsy   . Diabetes mellitus    metformin  . Obesity   . Palpitations   . Retinopathy   . Weakness    Past Surgical History:  Procedure Laterality Date  . ABDOMINAL HYSTERECTOMY  1996  . CESAREAN SECTION  1990   Family History  Problem Relation Age of Onset  . Other Father     complications of CVA  . Diabetes Brother   . Other Sister     multiple myeloma    Current Outpatient Prescriptions:  .  aspirin 81 MG chewable tablet, Chew 81 mg by mouth daily., Disp: , Rfl:  .  atorvastatin (LIPITOR) 10 MG tablet, Take 10 mg by mouth at bedtime., Disp: , Rfl:  .  metFORMIN (GLUCOPHAGE) 850 MG tablet, Take 850 mg by mouth 3 (three) times daily., Disp: , Rfl:  Social History: Reviewed -  reports that she has never smoked. She has never used smokeless tobacco.  Review of Systems:    Constitutional: Negative for fever and chills Eyes: Negative for visual disturbances Respiratory: Negative for shortness of breath, dyspnea Cardiovascular: Negative for chest pain or palpitations  Gastrointestinal: Negative for vomiting, diarrhea and constipation;  Genitourinary: Negative for dysuria and urgency, vaginal irritation or itching Musculoskeletal: Negative for back pain, joint pain, myalgias  Neurological: Negative for dizziness and headaches    Objective Findings:  Vitals: BP 108/60 (BP Location: Left Arm, Patient Position: Sitting, Cuff Size:  Normal)   Pulse 70   Ht _0  (1.448 m)   Wt 150 lb (68 kg)   BMI 32.46 kg/m   Physical Examination: General appearance - well appearing, and in no distress Mental status - alert, oriented to person, place, and time Chest:  Normal respiratory effort Heart - normal rate and regular rhythm Abdomen:  Soft, nontender, no guarding or rebound.   Pelvic: normal vagina.  "tender" to bimanual, R>L, but difficult to assess d/t habitus.  Tenderness is more from inner examining hand than abdominal hand.   Musculoskeletal:  Normal range of motion without pain Extremities:  No edema  Results for orders placed or performed in visit on 08/11/16 (from the past 24 hour(s))  POCT urinalysis dipstick   Collection Time: 08/11/16 12:14 PM  Result Value Ref Range   Color, UA     Clarity, UA     Glucose, UA 2+    Bilirubin, UA     Ketones, UA neg    Spec Grav, UA     Blood, UA neg    pH, UA     Protein, UA neg    Urobilinogen, UA     Nitrite, UA neg    Leukocytes, UA Negative Negative          Assessment & Plan:  A:   bilaleral pelvic pain of unknown origin.  P:  Start w/pelvis US  Return for pelvic US then MD  visit afterwards.   CRESENZO-DISHMAN,Earlyn Sylvan CNM 08/11/2016 2:50 PM

## 2016-08-13 NOTE — Telephone Encounter (Signed)
LMOM for a return call.  

## 2016-08-17 ENCOUNTER — Other Ambulatory Visit: Payer: Self-pay | Admitting: Advanced Practice Midwife

## 2016-08-17 DIAGNOSIS — R1031 Right lower quadrant pain: Secondary | ICD-10-CM

## 2016-08-17 DIAGNOSIS — R1032 Left lower quadrant pain: Secondary | ICD-10-CM

## 2016-08-19 ENCOUNTER — Ambulatory Visit: Payer: BLUE CROSS/BLUE SHIELD | Admitting: Obstetrics & Gynecology

## 2016-08-19 ENCOUNTER — Other Ambulatory Visit: Payer: BLUE CROSS/BLUE SHIELD

## 2016-08-25 ENCOUNTER — Telehealth: Payer: Self-pay

## 2016-08-25 NOTE — Telephone Encounter (Signed)
Triaged today.  

## 2016-09-01 NOTE — Telephone Encounter (Signed)
Gastroenterology Pre-Procedure Review  Request Date: 08/25/2016 Requesting Physician: Pearson GrippeJames Kim, MD  PATIENT REVIEW QUESTIONS: The patient responded to the following health history questions as indicated:    1. Diabetes Melitis: YES 2. Joint replacements in the past 12 months: no 3. Major health problems in the past 3 months: no 4. Has an artificial valve or MVP: no 5. Has a defibrillator: no 6. Has been advised in past to take antibiotics in advance of a procedure like teeth cleaning: no 7. Family history of colon cancer: no  8. Alcohol Use: no 9. History of sleep apnea: no  10. History of coronary artery or other vascular stents placed within the last 12 months: no    MEDICATIONS & ALLERGIES:    Patient reports the following regarding taking any blood thinners:   Plavix? no Aspirin? no Coumadin? no Brilinta? no Xarelto? no Eliquis? no Pradaxa? no Savaysa? no Effient? no  Patient confirms/reports the following medications:  Current Outpatient Prescriptions  Medication Sig Dispense Refill  . aspirin 81 MG chewable tablet Chew 81 mg by mouth daily.    Marland Kitchen. atorvastatin (LIPITOR) 10 MG tablet Take 10 mg by mouth at bedtime.    . metFORMIN (GLUCOPHAGE) 850 MG tablet Take 850 mg by mouth 3 (three) times daily.     No current facility-administered medications for this visit.     Patient confirms/reports the following allergies:  Allergies  Allergen Reactions  . Penicillins     No orders of the defined types were placed in this encounter.   AUTHORIZATION INFORMATION Primary Insurance:   ID #:  Group #:  Pre-Cert / Auth required:  Pre-Cert / Auth #:   Secondary Insurance:   ID #: Group #:  Pre-Cert / Auth required: Pre-Cert / Auth #:   SCHEDULE INFORMATION: Procedure has been scheduled as follows:  Date:  09/24/2016               Time:  8:30 AM Location[facility]  This Gastroenterology Pre-Precedure Review Form is being routed to the  following provider(s): Jonette EvaSandi Fields, MD

## 2016-09-02 NOTE — Telephone Encounter (Signed)
MOVI PREP sample SPLIT DOSING, REGULAR BREAKFAST. CLEAR LIQUIDS AFTER 9 AM.  CONTINUE GLUCOPHAGE.

## 2016-09-06 ENCOUNTER — Other Ambulatory Visit: Payer: Self-pay

## 2016-09-06 DIAGNOSIS — Z1211 Encounter for screening for malignant neoplasm of colon: Secondary | ICD-10-CM

## 2016-09-06 MED ORDER — PEG-KCL-NACL-NASULF-NA ASC-C 100 G PO SOLR: 1.0000 | ORAL | 0 refills | Status: DC

## 2016-09-06 NOTE — Telephone Encounter (Signed)
Rx sent to the pharmacy and instructions mailed to pt.  

## 2016-09-13 ENCOUNTER — Ambulatory Visit: Payer: BLUE CROSS/BLUE SHIELD | Admitting: Obstetrics & Gynecology

## 2016-09-13 ENCOUNTER — Other Ambulatory Visit: Payer: BLUE CROSS/BLUE SHIELD

## 2016-09-20 ENCOUNTER — Other Ambulatory Visit: Payer: BLUE CROSS/BLUE SHIELD

## 2016-09-20 ENCOUNTER — Encounter: Payer: Self-pay | Admitting: *Deleted

## 2016-09-20 ENCOUNTER — Ambulatory Visit: Payer: BLUE CROSS/BLUE SHIELD | Admitting: Obstetrics & Gynecology

## 2016-09-21 ENCOUNTER — Telehealth: Payer: Self-pay

## 2016-09-21 NOTE — Telephone Encounter (Signed)
I called [employer] and spoke to [place] Marks who said PA is not required for the screening colonoscopy.

## 2016-09-23 NOTE — Telephone Encounter (Signed)
Per Ginger, the hospital called to confirm this appt for tomorrow and the pt said he did not know anything about it.  I have called the daughter, Dairl PonderGuavalupe , who gave the triage information and left VM for her to call me right away in reference to the procedure that was scheduled for tomorrow. [phone]( [phone]).

## 2016-09-24 ENCOUNTER — Encounter (HOSPITAL_COMMUNITY): Admission: RE | Payer: Self-pay | Source: Ambulatory Visit

## 2016-09-24 ENCOUNTER — Ambulatory Visit (HOSPITAL_COMMUNITY)
Admission: RE | Admit: 2016-09-24 | Payer: BLUE CROSS/BLUE SHIELD | Source: Ambulatory Visit | Admitting: Gastroenterology

## 2016-09-24 SURGERY — COLONOSCOPY
Anesthesia: Moderate Sedation

## 2016-10-28 DIAGNOSIS — N390 Urinary tract infection, site not specified: Secondary | ICD-10-CM | POA: Diagnosis not present

## 2016-12-17 DIAGNOSIS — N760 Acute vaginitis: Secondary | ICD-10-CM | POA: Diagnosis not present

## 2017-03-15 DIAGNOSIS — H10501 Unspecified blepharoconjunctivitis, right eye: Secondary | ICD-10-CM | POA: Diagnosis not present

## 2017-03-28 ENCOUNTER — Ambulatory Visit: Payer: BLUE CROSS/BLUE SHIELD | Admitting: Family Medicine

## 2017-04-13 DIAGNOSIS — Z0000 Encounter for general adult medical examination without abnormal findings: Secondary | ICD-10-CM | POA: Diagnosis not present

## 2017-04-30 DIAGNOSIS — Z0000 Encounter for general adult medical examination without abnormal findings: Secondary | ICD-10-CM | POA: Diagnosis not present

## 2017-05-09 DIAGNOSIS — Z0000 Encounter for general adult medical examination without abnormal findings: Secondary | ICD-10-CM | POA: Diagnosis not present

## 2017-05-09 DIAGNOSIS — Z6828 Body mass index (BMI) 28.0-28.9, adult: Secondary | ICD-10-CM | POA: Diagnosis not present

## 2017-05-09 DIAGNOSIS — E1165 Type 2 diabetes mellitus with hyperglycemia: Secondary | ICD-10-CM | POA: Diagnosis not present

## 2017-05-09 DIAGNOSIS — M792 Neuralgia and neuritis, unspecified: Secondary | ICD-10-CM | POA: Diagnosis not present

## 2017-06-08 DIAGNOSIS — E1165 Type 2 diabetes mellitus with hyperglycemia: Secondary | ICD-10-CM | POA: Diagnosis not present

## 2017-06-08 DIAGNOSIS — Z6828 Body mass index (BMI) 28.0-28.9, adult: Secondary | ICD-10-CM | POA: Diagnosis not present

## 2017-07-06 DIAGNOSIS — Z6828 Body mass index (BMI) 28.0-28.9, adult: Secondary | ICD-10-CM | POA: Diagnosis not present

## 2017-07-06 DIAGNOSIS — E1165 Type 2 diabetes mellitus with hyperglycemia: Secondary | ICD-10-CM | POA: Diagnosis not present

## 2017-07-18 ENCOUNTER — Ambulatory Visit: Payer: BLUE CROSS/BLUE SHIELD | Admitting: Nutrition

## 2017-07-29 DIAGNOSIS — R110 Nausea: Secondary | ICD-10-CM | POA: Diagnosis not present

## 2017-08-01 DIAGNOSIS — H5711 Ocular pain, right eye: Secondary | ICD-10-CM | POA: Diagnosis not present

## 2017-08-01 DIAGNOSIS — E119 Type 2 diabetes mellitus without complications: Secondary | ICD-10-CM | POA: Diagnosis not present

## 2017-08-01 DIAGNOSIS — H4901 Third [oculomotor] nerve palsy, right eye: Secondary | ICD-10-CM | POA: Diagnosis not present

## 2017-08-02 ENCOUNTER — Encounter: Payer: BLUE CROSS/BLUE SHIELD | Attending: Internal Medicine | Admitting: Nutrition

## 2017-08-02 VITALS — Ht 59.0 in | Wt 151.0 lb

## 2017-08-02 DIAGNOSIS — Z713 Dietary counseling and surveillance: Secondary | ICD-10-CM | POA: Insufficient documentation

## 2017-08-02 DIAGNOSIS — H5711 Ocular pain, right eye: Secondary | ICD-10-CM | POA: Diagnosis not present

## 2017-08-02 DIAGNOSIS — E1165 Type 2 diabetes mellitus with hyperglycemia: Secondary | ICD-10-CM | POA: Insufficient documentation

## 2017-08-02 DIAGNOSIS — E669 Obesity, unspecified: Secondary | ICD-10-CM

## 2017-08-02 DIAGNOSIS — R112 Nausea with vomiting, unspecified: Secondary | ICD-10-CM | POA: Diagnosis not present

## 2017-08-02 DIAGNOSIS — IMO0002 Reserved for concepts with insufficient information to code with codable children: Secondary | ICD-10-CM

## 2017-08-02 DIAGNOSIS — G510 Bell's palsy: Secondary | ICD-10-CM | POA: Diagnosis not present

## 2017-08-02 DIAGNOSIS — E118 Type 2 diabetes mellitus with unspecified complications: Secondary | ICD-10-CM

## 2017-08-02 DIAGNOSIS — Z794 Long term (current) use of insulin: Secondary | ICD-10-CM

## 2017-08-02 NOTE — Progress Notes (Signed)
  Medical Nutrition Therapy:  Appt start time: 1130 end time:  1130.   Assessment:  Primary concerns today: LIves with husband and son and daughter; she does the cooking and shopping.  Her son is here with her today.  Eats 3 meals per day. On Metformin 850 mg TID. Testings once ady. Had episode of Bells Palsey and just got out of hospital this am. Doesn't feel good. Got sick on stomach and threw up. BS was 174 this am. Hasn't eaten lunch yet.. Will reschedule appt when we feels better. FBS: 172-400's Meds: Tresiba 35 units a day and Metformin 850 mg TID.  Preferred Learning Style:   Auditory  Learning Readiness:   Change in progress   MEDICATIONS: see   DIETARY INTAKE: 24-hr recall:  B ( AM):  Oatmeal or cereal or eggs or totilla  Snk ( AM): none  L ( PM): steak, chicken or eggs, rice or vegetables, tortillas,  Water, juice Snk ( PM):  D ( PM): bread, milk,  Snk ( PM):  Beverages: water,  juice  Usual physical activity: ADL  Estimated energy needs: 1200 calories 135 g carbohydrates 90 g protein 33 g fat  Progress Towards Goal(s):  In progress.   Nutritional Diagnosis:  NB-1.1 Food and nutrition-related knowledge deficit As related to Diabetes.  As evidenced by A1C.    Intervention:  Nutrition and Diabetes education provided on My Plate, CHO counting, meal planning, portion sizes, timing of meals, avoiding snacks between meals unless having a low blood sugar, target ranges for A1C and blood sugars, signs/symptoms and treatment of hyper/hypoglycemia, monitoring blood sugars, taking medications as prescribed, benefits of exercising 30 minutes per day and prevention of complications of DM.  Goals: Eat three meals per day. Do not skip meals.  Teaching Method Utilized:  Visual Auditory Hands on  Handouts given during visit include:  The Plate Method  Barriers to learning/adherence to lifestyle change: Bells Palsey right now  Demonstrated degree of understanding via:   Teach Back   Monitoring/Evaluation:  Dietary intake, and body weight in 1 week(s). Her son will call back when she is feeling better to  Reschedule and complete appt. Recommend to not exceed 2 mg a day of Metformin.

## 2017-08-02 NOTE — Patient Instructions (Signed)
Goals Eat three balanced meals per day.

## 2017-08-03 ENCOUNTER — Telehealth: Payer: Self-pay | Admitting: Nutrition

## 2017-08-03 NOTE — Telephone Encounter (Signed)
TC to check on pt. After she left sick yesterday. She notes she had nausea and some vomiting yesterday. She say Dr. Margo Aye office and they gave her a shot for her nausea. She notes she has been trying to eat a little- oatmeal, broccoli, grits, water, gatorade. BS was 204 this am. Denies fever. Taking meds as she was told. Encouraged her to stay in touch with her MD and drink plenty of fluid and eat small amounts of food every 2-3 hrs. She verbalized understanding.  Her daughter is scheduled to come by and check on her at 1 today. Will make appt after she feels better. She verbalized understanding.

## 2017-08-16 ENCOUNTER — Other Ambulatory Visit (HOSPITAL_COMMUNITY): Payer: Self-pay | Admitting: Internal Medicine

## 2017-08-16 DIAGNOSIS — Z1231 Encounter for screening mammogram for malignant neoplasm of breast: Secondary | ICD-10-CM

## 2017-08-23 DIAGNOSIS — H4901 Third [oculomotor] nerve palsy, right eye: Secondary | ICD-10-CM | POA: Diagnosis not present

## 2017-08-23 DIAGNOSIS — E119 Type 2 diabetes mellitus without complications: Secondary | ICD-10-CM | POA: Diagnosis not present

## 2017-08-23 DIAGNOSIS — H2513 Age-related nuclear cataract, bilateral: Secondary | ICD-10-CM | POA: Diagnosis not present

## 2017-08-23 DIAGNOSIS — Z794 Long term (current) use of insulin: Secondary | ICD-10-CM | POA: Diagnosis not present

## 2017-08-24 ENCOUNTER — Ambulatory Visit (HOSPITAL_COMMUNITY)
Admission: RE | Admit: 2017-08-24 | Discharge: 2017-08-24 | Disposition: A | Discharge status: Home / Self Care | Payer: BLUE CROSS/BLUE SHIELD | Source: Ambulatory Visit | Attending: Internal Medicine | Admitting: Internal Medicine

## 2017-08-24 DIAGNOSIS — Z1231 Encounter for screening mammogram for malignant neoplasm of breast: Secondary | ICD-10-CM | POA: Insufficient documentation

## 2017-10-13 DIAGNOSIS — M792 Neuralgia and neuritis, unspecified: Secondary | ICD-10-CM | POA: Diagnosis not present

## 2017-10-13 DIAGNOSIS — E1165 Type 2 diabetes mellitus with hyperglycemia: Secondary | ICD-10-CM | POA: Diagnosis not present

## 2017-10-14 DIAGNOSIS — R82998 Other abnormal findings in urine: Secondary | ICD-10-CM | POA: Diagnosis not present

## 2017-10-14 DIAGNOSIS — E1165 Type 2 diabetes mellitus with hyperglycemia: Secondary | ICD-10-CM | POA: Diagnosis not present

## 2017-12-19 ENCOUNTER — Ambulatory Visit: Payer: BLUE CROSS/BLUE SHIELD | Admitting: Diagnostic Neuroimaging

## 2017-12-19 ENCOUNTER — Telehealth: Payer: Self-pay | Admitting: *Deleted

## 2017-12-19 NOTE — Telephone Encounter (Signed)
Patient was no show for new patient appointment today. 

## 2017-12-20 ENCOUNTER — Encounter: Payer: Self-pay | Admitting: Diagnostic Neuroimaging

## 2018-02-13 ENCOUNTER — Ambulatory Visit: Payer: BLUE CROSS/BLUE SHIELD | Admitting: Diagnostic Neuroimaging

## 2018-02-13 ENCOUNTER — Encounter: Payer: Self-pay | Admitting: Diagnostic Neuroimaging

## 2018-02-13 VITALS — BP 129/75 | HR 78 | Ht 59.0 in | Wt 160.2 lb

## 2018-02-13 DIAGNOSIS — S0411XA Injury of oculomotor nerve, right side, initial encounter: Secondary | ICD-10-CM

## 2018-02-13 DIAGNOSIS — H5711 Ocular pain, right eye: Secondary | ICD-10-CM | POA: Diagnosis not present

## 2018-02-13 DIAGNOSIS — G510 Bell's palsy: Secondary | ICD-10-CM | POA: Diagnosis not present

## 2018-02-13 MED ORDER — GABAPENTIN 300 MG PO CAPS: 300.0000 mg | ORAL_CAPSULE | Freq: Three times a day (TID) | ORAL | 11 refills | Status: DC

## 2018-02-13 NOTE — Progress Notes (Signed)
[facility]  PATIENT: Kayla Marks DOB: Feb 04, 1961  REFERRING CLINICIAN: Merlyn Albert, MD HISTORY FROM: patient (via daughter who interprets) REASON FOR VISIT: new consult    HISTORICAL  CHIEF COMPLAINT:  Chief Complaint  Patient presents with  . Neuralgia    rm 6, New Pt, dgtr/interpreter- [place], "neuropathy around right eye and nose area, my teeth/gums hurt; right arm/shoulder bother me"    HISTORY OF PRESENT ILLNESS:   57 year old female here for evaluation of right facial pain.  2009 patient had right Bell's palsy (upper and lower facial weakness) which gradually improved.    Summer 2018, patient had a reaction to a new type of insulin with nausea and some other reactions.  Around the same time she developed ptosis in the right eye with pain in the eye.  She was treated with Lyrica and gabapentin without relief.  Right ptosis has slightly improved.  Pain is slightly improved.   REVIEW OF SYSTEMS: Full 14 system review of systems performed and negative with exception of: As per HPI.  ALLERGIES: Allergies  Allergen Reactions  . Insulins Nausea And Vomiting    Unknown insulin, injectible  . Penicillins Rash    HOME MEDICATIONS: Outpatient Medications Prior to Visit  Medication Sig Dispense Refill  . aspirin 81 MG chewable tablet Chew 81 mg by mouth daily.    Marland Kitchen atorvastatin (LIPITOR) 10 MG tablet Take 10 mg by mouth at bedtime.    . insulin degludec (TRESIBA FLEXTOUCH) 100 UNIT/ML SOPN FlexTouch Pen Inject into the skin daily at 10 pm.    . metFORMIN (GLUCOPHAGE) 1000 MG tablet 1,000 mg daily.  0  . pregabalin (LYRICA) 50 MG capsule Take 50 mg by mouth at bedtime.    . metFORMIN (GLUCOPHAGE) 850 MG tablet Take 850 mg by mouth 3 (three) times daily.    . peg 3350 powder (MOVIPREP) 100 g SOLR Take 1 kit (200 g total) by mouth as directed. (Patient not taking: Reported on 02/13/2018) 1 kit 0   No facility-administered medications prior to visit.      PAST MEDICAL HISTORY: Past Medical History:  Diagnosis Date  . Bell's palsy    history, approx 2011  . Diabetes mellitus    metformin  . Obesity   . Palpitations   . Retinopathy   . Right eye symptoms    "nerve issue"  . Weakness     PAST SURGICAL HISTORY: Past Surgical History:  Procedure Laterality Date  . ABDOMINAL HYSTERECTOMY  1996  . CESAREAN SECTION  1990    FAMILY HISTORY: Family History  Problem Relation Age of Onset  . Other Father        complications of CVA  . Diabetes Brother   . Other Sister        multiple myeloma    SOCIAL HISTORY:  Social History   Socioeconomic History  . Marital status: Married    Spouse name: Not on file  . Number of children: Not on file  . Years of education: Not on file  . Highest education level: Not on file  Occupational History  . Not on file  Social Needs  . Financial resource strain: Not on file  . Food insecurity:    Worry: Not on file    Inability: Not on file  . Transportation needs:    Medical: Not on file    Non-medical: Not on file  Tobacco Use  . Smoking status: Never Smoker  . Smokeless tobacco: Never Used  Substance and Sexual Activity  . Alcohol use: No  . Drug use: No  . Sexual activity: Yes    Birth control/protection: Surgical  Lifestyle  . Physical activity:    Days per week: Not on file    Minutes per session: Not on file  . Stress: Not on file  Relationships  . Social connections:    Talks on phone: Not on file    Gets together: Not on file    Attends religious service: Not on file    Active member of club or organization: Not on file    Attends meetings of clubs or organizations: Not on file    Relationship status: Not on file  . Intimate partner violence:    Fear of current or ex partner: Not on file    Emotionally abused: Not on file    Physically abused: Not on file    Forced sexual activity: Not on file  Other Topics Concern  . Not on file  Social History Narrative    Married, home maker   No regular exercise   Children 3   Education- 12      PHYSICAL EXAM  GENERAL EXAM/CONSTITUTIONAL: Vitals:  Vitals:   02/13/18 0854  BP: 129/75  Pulse: 78  Weight: 160 lb 3.2 oz (72.7 kg)  Height: '4\' 11"'  (1.499 m)     Body mass index is 32.36 kg/m.  Visual Acuity Screening   Right eye Left eye Both eyes  Without correction: 20/100 20/30   With correction:     Comments: &quot;nerve issue of R eye&quot;    Patient is in no distress; well developed, nourished and groomed; neck is supple  CARDIOVASCULAR:  Examination of carotid arteries is normal; no carotid bruits  Regular rate and rhythm, no murmurs  Examination of peripheral vascular system by observation and palpation is normal  EYES:  Ophthalmoscopic exam of optic discs and posterior segments is normal; no papilledema or hemorrhages  MUSCULOSKELETAL:  Gait, strength, tone, movements noted in Neurologic exam below  NEUROLOGIC: MENTAL STATUS:  No flowsheet data found.  awake, alert, oriented to person, place and time  recent and remote memory intact  normal attention and concentration  language fluent, comprehension intact, naming intact,   fund of knowledge appropriate  CRANIAL NERVE:   2nd - no papilledema on fundoscopic exam  2nd, 3rd, 4th, 6th - pupils equal and reactive to light, visual fields full to confrontation, extraocular muscles intact, no nystagmus  5th - facial sensation --> DECR TEMP AND LT IN RIGHT FACE  7th - facial strength symmetric; RIGHT LOWER SYNKINESIS WITH EYE BROW RAISE  8th - hearing intact  9th - palate elevates symmetrically, uvula midline  11th - shoulder shrug symmetric  12th - tongue protrusion midline  MOTOR:   normal bulk and tone, full strength in the BUE, BLE  SENSORY:   normal and symmetric to light touch, pinprick, temperature, vibration  COORDINATION:   finger-nose-finger, fine finger movements normal  REFLEXES:    deep tendon reflexes present and symmetric  GAIT/STATION:   narrow based gait; able to walk on toes, heels and tandem; romberg is negative    DIAGNOSTIC DATA (LABS, IMAGING, TESTING) - I reviewed patient records, labs, notes, testing and imaging myself where available.  Lab Results  Component Value Date   WBC 8.0 10/08/2014   HGB 13.9 10/08/2014   HCT 39.6 10/08/2014   MCV 89.6 10/08/2014   PLT 274 10/08/2014      Component Value Date/Time  NA 138 10/08/2014 1514   K 3.6 (L) 10/08/2014 1514   CL 99 10/08/2014 1514   CO2 27 10/08/2014 1514   GLUCOSE 174 (H) 10/08/2014 1514   BUN 11 10/08/2014 1514   CREATININE 0.44 (L) 10/08/2014 1514   CALCIUM 9.2 10/08/2014 1514   GFRNONAA >90 10/08/2014 1514   GFRAA >90 10/08/2014 1514   No results found for: CHOL, HDL, LDLCALC, LDLDIRECT, TRIG, CHOLHDL No results found for: HGBA1C No results found for: VITAMINB12 Lab Results  Component Value Date   TSH 1.500 06/30/2014    10/08/14 MRI brain  - No acute abnormality. - Small frontal white matter hyperintensities bilaterally. This may be related to chronic microvascular ischemia or migraine headache. Pattern is not typical for demyelinating disease.     ASSESSMENT AND PLAN  57 y.o. 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  We will proceed with further workup.  We will also titrate gabapentin higher for pain relief.    Dx: right CN3 palsy (likely diabetic microvascular infarction to the nerve)  1. Injury of right oculomotor nerve, initial encounter   2. Acute right eye pain   3. Right-sided Bell's palsy      PLAN:  - MRI brain to rule out other causes (stroke, mass, inflamm) - start gabapentin 370m at bedtime; increase to twice a day and then three times a day  - continue diabetes control  Orders Placed This Encounter  Procedures  . MR BRAIN/IAC W WO CONTRAST   Meds ordered this encounter   Medications  . gabapentin (NEURONTIN) 300 MG capsule    Sig: Take 1 capsule (300 mg total) by mouth 3 (three) times daily.    Dispense:  90 capsule    Refill:  11   Return in about 4 months (around 06/15/2018).    VPenni Bombard MD 46/11/4707 92:95AM Certified in Neurology, Neurophysiology and Neuroimaging  [facility] [address] [place] [phone]

## 2018-02-13 NOTE — Patient Instructions (Signed)
-   MRI brain  - start gabapentin 300mg  at bedtime; after 1-2 weeks may increase to twice a day; after 1-2 weeks then may increase to three times a day   - continue to improve diabetes control

## 2018-02-14 DIAGNOSIS — M792 Neuralgia and neuritis, unspecified: Secondary | ICD-10-CM | POA: Diagnosis not present

## 2018-02-14 DIAGNOSIS — R82998 Other abnormal findings in urine: Secondary | ICD-10-CM | POA: Diagnosis not present

## 2018-02-14 DIAGNOSIS — E1165 Type 2 diabetes mellitus with hyperglycemia: Secondary | ICD-10-CM | POA: Diagnosis not present

## 2018-02-15 ENCOUNTER — Telehealth: Payer: Self-pay | Admitting: Diagnostic Neuroimaging

## 2018-02-15 NOTE — Telephone Encounter (Signed)
[employer] Auth: [id number] (exp. 02/15/18 to 03/16/18) order sent to GI. They will contact pt to schedule.

## 2018-02-17 DIAGNOSIS — M792 Neuralgia and neuritis, unspecified: Secondary | ICD-10-CM | POA: Diagnosis not present

## 2018-02-17 DIAGNOSIS — E1165 Type 2 diabetes mellitus with hyperglycemia: Secondary | ICD-10-CM | POA: Diagnosis not present

## 2018-03-16 DIAGNOSIS — M5441 Lumbago with sciatica, right side: Secondary | ICD-10-CM | POA: Diagnosis not present

## 2018-03-19 DIAGNOSIS — J0100 Acute maxillary sinusitis, unspecified: Secondary | ICD-10-CM | POA: Diagnosis not present

## 2018-03-20 DIAGNOSIS — M5441 Lumbago with sciatica, right side: Secondary | ICD-10-CM | POA: Diagnosis not present

## 2018-06-10 DIAGNOSIS — H524 Presbyopia: Secondary | ICD-10-CM | POA: Diagnosis not present

## 2018-08-21 ENCOUNTER — Ambulatory Visit: Payer: BLUE CROSS/BLUE SHIELD | Admitting: Diagnostic Neuroimaging

## 2018-08-21 ENCOUNTER — Encounter: Payer: Self-pay | Admitting: Diagnostic Neuroimaging

## 2018-08-21 VITALS — BP 133/74 | HR 71 | Ht 59.0 in | Wt 155.2 lb

## 2018-08-21 DIAGNOSIS — H5713 Ocular pain, bilateral: Secondary | ICD-10-CM

## 2018-08-21 NOTE — Patient Instructions (Signed)
-   check MRI brain  - stop gabapentin  - follow up with ophthalmology (eye

## 2018-08-21 NOTE — Progress Notes (Signed)
[facility]  PATIENT: Kayla Marks DOB: [birth year]  REFERRING CLINICIAN: Merlyn Albert, MD HISTORY FROM: patient (via husband who interprets) REASON FOR VISIT: follow up    HISTORICAL  CHIEF COMPLAINT:  Chief Complaint  Patient presents with  . Injury of R oculomotor nerve    rm 7, husband- [place], "my facial pain is a little better"  . Follow-up    6 month    HISTORY OF PRESENT ILLNESS:   UPDATE (08/21/18, VRP): Since last visit, right eye is improve. Now left eye is intermittently bothering her; feels like dust or "trash" is in left eye. Right ptosis is resolved. Symptoms are intermittent. Severity is moderate. No alleviating or aggravating factors. Patient states she was not contacted re: MRI.   PRIOR HPI  (02/13/18): 57 year old female here for evaluation of right facial pain.  2009 patient had right Bell's palsy (upper and lower facial weakness) which gradually improved.    Summer 2018, patient had a reaction to a new type of insulin with nausea and some other reactions.  Around the same time she developed ptosis in the right eye with pain in the eye.  She was treated with Lyrica and gabapentin without relief.  Right ptosis has slightly improved.  Pain is slightly improved.   REVIEW OF SYSTEMS: Full 14 system review of systems performed and negative with exception of: eye itching.   ALLERGIES: Allergies  Allergen Reactions  . Insulins Nausea And Vomiting    Unknown insulin, injectible  . Penicillins Rash    HOME MEDICATIONS: Outpatient Medications Prior to Visit  Medication Sig Dispense Refill  . insulin degludec (TRESIBA FLEXTOUCH) 100 UNIT/ML SOPN FlexTouch Pen Inject into the skin daily at 10 pm.    . metFORMIN (GLUCOPHAGE) 1000 MG tablet 1,000 mg daily.  0  . aspirin 81 MG chewable tablet Chew 81 mg by mouth daily.    Marland Kitchen atorvastatin (LIPITOR) 10 MG tablet Take 10 mg by mouth at bedtime.    . gabapentin (NEURONTIN) 300 MG capsule Take 1 capsule  (300 mg total) by mouth 3 (three) times daily. (Patient not taking: Reported on 08/21/2018) 90 capsule 11  . peg 3350 powder (MOVIPREP) 100 g SOLR Take 1 kit (200 g total) by mouth as directed. (Patient not taking: Reported on 02/13/2018) 1 kit 0  . pregabalin (LYRICA) 50 MG capsule Take 50 mg by mouth at bedtime.     No facility-administered medications prior to visit.     PAST MEDICAL HISTORY: Past Medical History:  Diagnosis Date  . Bell's palsy    history, approx 2011  . Diabetes mellitus    metformin  . Obesity   . Palpitations   . Retinopathy   . Right eye symptoms    "nerve issue"  . Weakness     PAST SURGICAL HISTORY: Past Surgical History:  Procedure Laterality Date  . ABDOMINAL HYSTERECTOMY  1996  . CESAREAN SECTION  1990    FAMILY HISTORY: Family History  Problem Relation Age of Onset  . Other Father        complications of CVA  . Diabetes Brother   . Other Sister        multiple myeloma    SOCIAL HISTORY:  Social History   Socioeconomic History  . Marital status: Married    Spouse name: [place]  . Number of children: Not on file  . Years of education: Not on file  . Highest education level: Not on file  Occupational History  .  Not on file  Social Needs  . Financial resource strain: Not on file  . Food insecurity:    Worry: Not on file    Inability: Not on file  . Transportation needs:    Medical: Not on file    Non-medical: Not on file  Tobacco Use  . Smoking status: Never Smoker  . Smokeless tobacco: Never Used  Substance and Sexual Activity  . Alcohol use: No  . Drug use: No  . Sexual activity: Yes    Birth control/protection: Surgical  Lifestyle  . Physical activity:    Days per week: Not on file    Minutes per session: Not on file  . Stress: Not on file  Relationships  . Social connections:    Talks on phone: Not on file    Gets together: Not on file    Attends religious service: Not on file    Active member of club or  organization: Not on file    Attends meetings of clubs or organizations: Not on file    Relationship status: Not on file  . Intimate partner violence:    Fear of current or ex partner: Not on file    Emotionally abused: Not on file    Physically abused: Not on file    Forced sexual activity: Not on file  Other Topics Concern  . Not on file  Social History Narrative   Married, home maker   No regular exercise   Children 3   Education- 12      PHYSICAL EXAM  GENERAL EXAM/CONSTITUTIONAL: Vitals:  Vitals:   08/21/18 1604  BP: 133/74  Pulse: 71  Weight: 155 lb 3.2 oz (70.4 kg)  Height: _0  (1.499 m)   Body mass index is 31.35 kg/m.  Visual Acuity Screening   Right eye Left eye Both eyes  Without correction: 20/50 20/40   With correction:       Patient is in no distress; well developed, nourished and groomed; neck is supple  CARDIOVASCULAR:  Examination of carotid arteries is normal; no carotid bruits  Regular rate and rhythm, no murmurs  Examination of peripheral vascular system by observation and palpation is normal  EYES:  Ophthalmoscopic exam of optic discs and posterior segments is normal; no papilledema or hemorrhages  MUSCULOSKELETAL:  Gait, strength, tone, movements noted in Neurologic exam below  NEUROLOGIC: MENTAL STATUS:  No flowsheet data found.  awake, alert, oriented to person, place and time  recent and remote memory intact  normal attention and concentration  language fluent, comprehension intact, naming intact,   fund of knowledge appropriate  CRANIAL NERVE:   2nd - no papilledema on fundoscopic exam  2nd, 3rd, 4th, 6th - pupils equal and reactive to light, visual fields full to confrontation, extraocular muscles intact, no nystagmus  5th - facial sensation --> normal and symm  7th - facial strength symmetric  8th - hearing intact  9th - palate elevates symmetrically, uvula midline  11th - shoulder shrug  symmetric  12th - tongue protrusion midline  MOTOR:   normal bulk and tone, full strength in the BUE, BLE  SENSORY:   normal and symmetric to light touch, pinprick, temperature, vibration  COORDINATION:   finger-nose-finger, fine finger movements normal  REFLEXES:   deep tendon reflexes present and symmetric  GAIT/STATION:   narrow based gait; able to walk on toes, heels and tandem; romberg is negative    DIAGNOSTIC DATA (LABS, IMAGING, TESTING) - I reviewed patient records, labs, notes,  testing and imaging myself where available.  Lab Results  Component Value Date   WBC 8.0 10/08/2014   HGB 13.9 10/08/2014   HCT 39.6 10/08/2014   MCV 89.6 10/08/2014   PLT 274 10/08/2014      Component Value Date/Time   NA 138 10/08/2014 1514   K 3.6 (L) 10/08/2014 1514   CL 99 10/08/2014 1514   CO2 27 10/08/2014 1514   GLUCOSE 174 (H) 10/08/2014 1514   BUN 11 10/08/2014 1514   CREATININE 0.44 (L) 10/08/2014 1514   CALCIUM 9.2 10/08/2014 1514   GFRNONAA >90 10/08/2014 1514   GFRAA >90 10/08/2014 1514   No results found for: CHOL, HDL, LDLCALC, LDLDIRECT, TRIG, CHOLHDL No results found for: HGBA1C No results found for: VITAMINB12 Lab Results  Component Value Date   TSH 1.500 06/30/2014    10/08/14 MRI brain  - No acute abnormality. - Small frontal white matter hyperintensities bilaterally. This may be related to chronic microvascular ischemia or migraine headache. Pattern is not typical for demyelinating disease.     ASSESSMENT AND PLAN  57 y.o. 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  We will proceed with further workup.  We will also titrate gabapentin higher for pain relief.    Dx: right CN3 palsy (likely diabetic microvascular infarction to the nerve)  1. Pain of both eyes      PLAN:  - follow up MRI brain to rule out other causes (stroke, mass, inflamm); ordered at last visit, but  patient states not contacted to setup; will try to setup again  - refer to ophthalmology for bilateral anterior eye pain (intermittent; alternating)  - may stop gabapentin (because pain is somewhat improved from before)  - continue diabetes control  Orders Placed This Encounter  Procedures  . Ambulatory referral to Ophthalmology   Return for pending test results.    Penni Bombard, MD 10/27/7587, 3:25 PM Certified in Neurology, Neurophysiology and Neuroimaging  [facility] [address], [place], [place] [address] [phone]

## 2018-08-31 DIAGNOSIS — E1165 Type 2 diabetes mellitus with hyperglycemia: Secondary | ICD-10-CM | POA: Diagnosis not present

## 2018-08-31 DIAGNOSIS — R82998 Other abnormal findings in urine: Secondary | ICD-10-CM | POA: Diagnosis not present

## 2018-08-31 DIAGNOSIS — M792 Neuralgia and neuritis, unspecified: Secondary | ICD-10-CM | POA: Diagnosis not present

## 2018-09-01 DIAGNOSIS — H10411 Chronic giant papillary conjunctivitis, right eye: Secondary | ICD-10-CM | POA: Diagnosis not present

## 2018-09-04 DIAGNOSIS — E782 Mixed hyperlipidemia: Secondary | ICD-10-CM | POA: Diagnosis not present

## 2018-09-04 DIAGNOSIS — R300 Dysuria: Secondary | ICD-10-CM | POA: Diagnosis not present

## 2018-09-04 DIAGNOSIS — E1165 Type 2 diabetes mellitus with hyperglycemia: Secondary | ICD-10-CM | POA: Diagnosis not present

## 2018-09-25 DIAGNOSIS — E1165 Type 2 diabetes mellitus with hyperglycemia: Secondary | ICD-10-CM | POA: Diagnosis not present

## 2018-09-25 DIAGNOSIS — Z6838 Body mass index (BMI) 38.0-38.9, adult: Secondary | ICD-10-CM | POA: Diagnosis not present

## 2018-10-19 DIAGNOSIS — R300 Dysuria: Secondary | ICD-10-CM | POA: Diagnosis not present

## 2018-10-19 DIAGNOSIS — E1165 Type 2 diabetes mellitus with hyperglycemia: Secondary | ICD-10-CM | POA: Diagnosis not present

## 2018-10-19 DIAGNOSIS — R82998 Other abnormal findings in urine: Secondary | ICD-10-CM | POA: Diagnosis not present

## 2018-10-19 DIAGNOSIS — R319 Hematuria, unspecified: Secondary | ICD-10-CM | POA: Diagnosis not present

## 2019-03-13 ENCOUNTER — Ambulatory Visit: Payer: BLUE CROSS/BLUE SHIELD | Admitting: Neurology

## 2019-04-04 DIAGNOSIS — E1165 Type 2 diabetes mellitus with hyperglycemia: Secondary | ICD-10-CM | POA: Diagnosis not present

## 2019-04-04 DIAGNOSIS — E782 Mixed hyperlipidemia: Secondary | ICD-10-CM | POA: Diagnosis not present

## 2019-04-24 DIAGNOSIS — E782 Mixed hyperlipidemia: Secondary | ICD-10-CM | POA: Diagnosis not present

## 2019-04-24 DIAGNOSIS — H5711 Ocular pain, right eye: Secondary | ICD-10-CM | POA: Diagnosis not present

## 2019-04-24 DIAGNOSIS — E1165 Type 2 diabetes mellitus with hyperglycemia: Secondary | ICD-10-CM | POA: Diagnosis not present

## 2019-11-14 DIAGNOSIS — E782 Mixed hyperlipidemia: Secondary | ICD-10-CM | POA: Diagnosis not present

## 2019-11-14 DIAGNOSIS — E1165 Type 2 diabetes mellitus with hyperglycemia: Secondary | ICD-10-CM | POA: Diagnosis not present

## 2019-11-15 DIAGNOSIS — Z0001 Encounter for general adult medical examination with abnormal findings: Secondary | ICD-10-CM | POA: Diagnosis not present

## 2020-04-15 DIAGNOSIS — H5711 Ocular pain, right eye: Secondary | ICD-10-CM | POA: Diagnosis not present

## 2020-04-15 DIAGNOSIS — E782 Mixed hyperlipidemia: Secondary | ICD-10-CM | POA: Diagnosis not present

## 2020-04-15 DIAGNOSIS — R1010 Upper abdominal pain, unspecified: Secondary | ICD-10-CM | POA: Diagnosis not present

## 2020-04-15 DIAGNOSIS — E1165 Type 2 diabetes mellitus with hyperglycemia: Secondary | ICD-10-CM | POA: Diagnosis not present

## 2020-04-18 ENCOUNTER — Other Ambulatory Visit (HOSPITAL_COMMUNITY): Payer: Self-pay | Admitting: Internal Medicine

## 2020-04-18 ENCOUNTER — Other Ambulatory Visit: Payer: Self-pay | Admitting: Internal Medicine

## 2020-04-18 DIAGNOSIS — R1010 Upper abdominal pain, unspecified: Secondary | ICD-10-CM

## 2020-07-10 ENCOUNTER — Other Ambulatory Visit: Payer: Self-pay

## 2020-07-17 ENCOUNTER — Other Ambulatory Visit: Payer: Self-pay

## 2020-11-14 ENCOUNTER — Emergency Department: Payer: 59

## 2020-11-14 ENCOUNTER — Encounter: Payer: Self-pay | Admitting: Emergency Medicine

## 2020-11-14 ENCOUNTER — Other Ambulatory Visit: Payer: Self-pay

## 2020-11-14 ENCOUNTER — Emergency Department
Admission: EM | Admit: 2020-11-14 | Discharge: 2020-11-15 | Disposition: A | Discharge status: Home / Self Care | Payer: 59 | Attending: Emergency Medicine | Admitting: Emergency Medicine

## 2020-11-14 DIAGNOSIS — Z7984 Long term (current) use of oral hypoglycemic drugs: Secondary | ICD-10-CM | POA: Insufficient documentation

## 2020-11-14 DIAGNOSIS — E11319 Type 2 diabetes mellitus with unspecified diabetic retinopathy without macular edema: Secondary | ICD-10-CM | POA: Diagnosis not present

## 2020-11-14 DIAGNOSIS — K8020 Calculus of gallbladder without cholecystitis without obstruction: Secondary | ICD-10-CM

## 2020-11-14 DIAGNOSIS — R1011 Right upper quadrant pain: Secondary | ICD-10-CM

## 2020-11-14 DIAGNOSIS — Z7982 Long term (current) use of aspirin: Secondary | ICD-10-CM | POA: Diagnosis not present

## 2020-11-14 DIAGNOSIS — Z794 Long term (current) use of insulin: Secondary | ICD-10-CM | POA: Diagnosis not present

## 2020-11-14 LAB — COMPREHENSIVE METABOLIC PANEL
ALT: 23 U/L (ref 0–44)
AST: 20 U/L (ref 15–41)
Albumin: 4.3 g/dL (ref 3.5–5.0)
Alkaline Phosphatase: 78 U/L (ref 38–126)
Anion gap: 9 (ref 5–15)
BUN: 11 mg/dL (ref 6–20)
CO2: 26 mmol/L (ref 22–32)
Calcium: 9.6 mg/dL (ref 8.9–10.3)
Chloride: 102 mmol/L (ref 98–111)
Creatinine, Ser: 0.33 mg/dL — ABNORMAL LOW (ref 0.44–1.00)
GFR, Estimated: >60 mL/min (ref >60)
Glucose, Bld: 274 mg/dL — ABNORMAL HIGH (ref 70–99)
Potassium: 3.8 mmol/L (ref 3.5–5.1)
Sodium: 137 mmol/L (ref 135–145)
Total Bilirubin: 0.8 mg/dL (ref 0.3–1.2)
Total Protein: 7.6 g/dL (ref 6.5–8.1)

## 2020-11-14 LAB — CBC
HCT: 45.5 % (ref 36.0–46.0)
Hemoglobin: 15.7 g/dL — ABNORMAL HIGH (ref 12.0–15.0)
MCH: 30.5 pg (ref 26.0–34.0)
MCHC: 34.5 g/dL (ref 30.0–36.0)
MCV: 88.3 fL (ref 80.0–100.0)
Platelets: 293 10*3/uL (ref 150–400)
RBC: 5.15 MIL/uL — ABNORMAL HIGH (ref 3.87–5.11)
RDW: 11.8 % (ref 11.5–15.5)
WBC: 7.1 10*3/uL (ref 4.0–10.5)
nRBC: 0 % (ref 0.0–0.2)

## 2020-11-14 LAB — URINALYSIS, COMPLETE (UACMP) WITH MICROSCOPIC
Bacteria, UA: NONE SEEN
Bilirubin Urine: NEGATIVE
Glucose, UA: >=500 mg/dL — AB
Hgb urine dipstick: NEGATIVE
Ketones, ur: NEGATIVE mg/dL
Leukocytes,Ua: NEGATIVE
Nitrite: NEGATIVE
Protein, ur: NEGATIVE mg/dL
Specific Gravity, Urine: 1.016 (ref 1.005–1.030)
pH: 5 (ref 5.0–8.0)

## 2020-11-14 LAB — LIPASE, BLOOD: Lipase: 30 U/L (ref 11–51)

## 2020-11-14 MED ORDER — LACTATED RINGERS IV BOLUS
1000.0000 mL | Freq: Once | INTRAVENOUS | Status: AC
  Administered 2020-11-15: 1000 mL via INTRAVENOUS

## 2020-11-14 MED ORDER — ONDANSETRON HCL 4 MG/2ML IJ SOLN
4.0000 mg | Freq: Once | INTRAMUSCULAR | Status: AC
  Administered 2020-11-14: 4 mg via INTRAVENOUS
  Filled 2020-11-14: qty 2

## 2020-11-14 MED ORDER — FENTANYL CITRATE (PF) 100 MCG/2ML IJ SOLN
50.0000 ug | Freq: Once | INTRAMUSCULAR | Status: AC
  Administered 2020-11-14: 50 ug via INTRAVENOUS
  Filled 2020-11-14: qty 2

## 2020-11-14 MED ORDER — IOHEXOL 300 MG/ML  SOLN
100.0000 mL | Freq: Once | INTRAMUSCULAR | Status: AC | PRN
  Administered 2020-11-14: 100 mL via INTRAVENOUS

## 2020-11-14 NOTE — ED Triage Notes (Signed)
Pt to ED via POV c/o RUQ abdominal pain since yesterday. Pt denies any vomiting. Pt is in NAD.

## 2020-11-15 ENCOUNTER — Emergency Department: Payer: 59

## 2020-11-15 MED ORDER — ONDANSETRON 4 MG PO TBDP
4.0000 mg | ORAL_TABLET | Freq: Three times a day (TID) | ORAL | 0 refills | Status: DC | PRN
Start: 2020-11-15 — End: 2020-11-20

## 2020-11-15 MED ORDER — IBUPROFEN 600 MG PO TABS: 600.0000 mg | ORAL_TABLET | Freq: Four times a day (QID) | ORAL | 0 refills | Status: DC | PRN

## 2020-11-15 MED ORDER — KETOROLAC TROMETHAMINE 30 MG/ML IJ SOLN
15.0000 mg | Freq: Once | INTRAMUSCULAR | Status: AC
  Administered 2020-11-15: 15 mg via INTRAVENOUS
  Filled 2020-11-15: qty 1

## 2020-11-15 NOTE — ED Provider Notes (Signed)
[facility] Emergency Department Provider Note  ____________________________________________  Time seen: Approximately 12:35 AM  I have reviewed the triage vital signs and the nursing notes.   HISTORY  Chief Complaint Abdominal Pain   HPI Kayla Marks is a 60 y.o. female with a history of diabetes, hyperlipidemia,  hysterectomy who presents for evaluation of abdominal pain.  Patient reports intermittent right-sided abdominal pain for the last 2 weeks.  She is unable to determine any alleviating or provoking events. Pain is associated with nausea but no vomiting. She is not sure if eating exacerbates the pain. Has been taking OTC pain medications, Currently pain is 7/10. Pain is sharp and does not radiate. No vaginal discharge or bleeding. No constipation or diarrhea. No urinary symptoms. Patient sent here by PCP for imaging.  Past Medical History:  Diagnosis Date  . Bell's palsy    history, approx 2011  . Diabetes mellitus    metformin  . Obesity   . Palpitations   . Retinopathy   . Right eye symptoms    "nerve issue"  . Weakness     Patient Active Problem List   Diagnosis Date Noted  . HYPERLIPIDEMIA-MIXED 10/09/2010  . OVERWEIGHT/OBESITY 10/09/2010  . ABNORMAL EKG 10/09/2010    Past Surgical History:  Procedure Laterality Date  . ABDOMINAL HYSTERECTOMY  1996  . [place]    Prior to Admission medications   Medication Sig Start Date End Date Taking? Authorizing Provider  ibuprofen (ADVIL) 600 MG tablet Take 1 tablet (600 mg total) by mouth every 6 (six) hours as needed. 11/15/20  Yes Alfred Levins, [place], MD  ondansetron (ZOFRAN ODT) 4 MG disintegrating tablet Take 1 tablet (4 mg total) by mouth every 8 (eight) hours as needed. 11/15/20  Yes Marticia Reifschneider, [place], MD  aspirin 81 MG chewable tablet Chew 81 mg by mouth daily.    [provider]  atorvastatin (LIPITOR) 10 MG tablet Take 10 mg by mouth at bedtime.    [provider]  insulin degludec (TRESIBA FLEXTOUCH) 100 UNIT/ML SOPN FlexTouch Pen Inject into the skin daily at 10 pm.    [provider]  metFORMIN (GLUCOPHAGE) 1000 MG tablet 1,000 mg daily. 01/09/18   [provider]  peg 3350 powder (MOVIPREP) 100 g SOLR Take 1 kit (200 g total) by mouth as directed. Patient not taking: Reported on 02/13/2018 09/06/16   Danie Binder, MD  pregabalin (LYRICA) 50 MG capsule Take 50 mg by mouth at bedtime.    [provider]    Allergies Insulins and Penicillins  Family History  Problem Relation Age of Onset  . Other Father        complications of CVA  . Diabetes Brother   . Other Sister        multiple myeloma    Social History Social History   Tobacco Use  . Smoking status: Never Smoker  . Smokeless tobacco: Never Used  Substance Use Topics  . Alcohol use: No  . Drug use: No    Review of Systems  Constitutional: Negative for fever. Eyes: Negative for visual changes. ENT: Negative for sore throat. Neck: No neck pain  Cardiovascular: Negative for chest pain. Respiratory: Negative for shortness of breath. Gastrointestinal: + R sided abdominal pain and nausea. No vomiting or diarrhea. Genitourinary: Negative for dysuria. Musculoskeletal: Negative for back pain. Skin: Negative for rash. Neurological: Negative for headaches, weakness or numbness. Psych: No SI or HI  ____________________________________________   PHYSICAL  EXAM:  VITAL SIGNS: ED Triage Vitals  Enc Vitals Group     BP 11/14/20 1750 123/72     Pulse Rate 11/14/20 1750 75     Resp 11/14/20 1750 18     Temp 11/14/20 1750 98.8 F (37.1 C)     Temp Source 11/14/20 1750 Oral     SpO2 11/14/20 1750 99 %     Weight 11/14/20 1750 150 lb (68 kg)     Height 11/14/20 1750 '4\' 11"'  (1.499 m)     Head Circumference --      Peak Flow --      Pain Score 11/14/20 1753 7     Pain Loc --      Pain Edu? --      Excl. in [place]? --     Constitutional:  Alert and oriented. Well appearing and in no apparent distress. HEENT:      Head: Normocephalic and atraumatic.         Eyes: Conjunctivae are normal. Sclera is non-icteric.       Mouth/Throat: Mucous membranes are moist.       Neck: Supple with no signs of meningismus. Cardiovascular: Regular rate and rhythm. No murmurs, gallops, or rubs.  Respiratory: Normal respiratory effort. Lungs are clear to auscultation bilaterally.  Gastrointestinal: Soft, pain with palpation the right upper quadrant, negative Murphy sign, and non distended with positive bowel sounds. No rebound or guarding. Genitourinary: No CVA tenderness. Musculoskeletal:  No edema, cyanosis, or erythema of extremities. Neurologic: Normal speech and language. Face is symmetric. Moving all extremities. No gross focal neurologic deficits are appreciated. Skin: Skin is warm, dry and intact. No rash noted. Psychiatric: Mood and affect are normal. Speech and behavior are normal.  ____________________________________________   LABS (all labs ordered are listed, but only abnormal results are displayed)  Labs Reviewed  COMPREHENSIVE METABOLIC PANEL - Abnormal; Notable for the following components:      Result Value   Glucose, Bld 274 (*)    Creatinine, Ser 0.33 (*)    All other components within normal limits  CBC - Abnormal; Notable for the following components:   RBC 5.15 (*)    Hemoglobin 15.7 (*)    All other components within normal limits  URINALYSIS, COMPLETE (UACMP) WITH MICROSCOPIC - Abnormal; Notable for the following components:   Color, Urine YELLOW (*)    APPearance CLEAR (*)    Glucose, UA >=500 (*)    All other components within normal limits  LIPASE, BLOOD   ____________________________________________  EKG  none  ____________________________________________  RADIOLOGY  I have personally reviewed the images performed during this visit and I agree with the Radiologist's read.   Interpretation by  Radiologist:  CT ABDOMEN PELVIS W CONTRAST  Result Date: 11/15/2020 CLINICAL DATA:  Abdominal pain. Patient reports right upper quadrant abdominal pain since yesterday. EXAM: CT ABDOMEN AND PELVIS WITH CONTRAST TECHNIQUE: Multidetector CT imaging of the abdomen and pelvis was performed using the standard protocol following bolus administration of intravenous contrast. CONTRAST:  133m OMNIPAQUE IOHEXOL 300 MG/ML  SOLN COMPARISON:  None. FINDINGS: Lower chest: Borderline cardiomegaly. Motion artifact at the lung bases. Scattered basilar atelectasis. No pleural fluid. Hepatobiliary: Enlarged liver spanning 20 cm cranial caudal with diffuse hepatic steatosis. Focal fatty sparing adjacent to the gallbladder fossa. Equivocal gallbladder hyperemia. No gallbladder wall thickening, calcified gallstone or pericholecystic fat stranding. No biliary dilatation. Pancreas: No ductal dilatation or inflammation. Spleen: Normal in size without focal abnormality. Adrenals/Urinary Tract: Normal adrenal glands.  No hydronephrosis or perinephric edema. Homogeneous renal enhancement with symmetric excretion on delayed phase imaging. Cortical subcentimeter hypodensities within both kidneys are too small to accurately characterize, typically cysts. Urinary bladder is physiologically distended without wall thickening. Stomach/Bowel: Small hiatal hernia. Decompressed stomach. No small bowel obstruction or inflammation. Normal terminal ileum. Normal appendix. Moderate stool burden throughout the colon. Tortuosity of the splenic flexure. No colonic wall thickening or pericolonic edema. Vascular/Lymphatic: Abdominal aorta is normal in caliber. The portal vein is patent. No enlarged lymph nodes in the abdomen or pelvis. Reproductive: Status post hysterectomy. No adnexal masses. Other: No free air, free fluid, or intra-abdominal fluid collection. Bilateral gluteal granulomas in the subcutaneous tissues. Musculoskeletal: Facet hypertrophy at  L4-L5 with resultant grade 1 anterolisthesis. There are no acute or suspicious osseous abnormalities. IMPRESSION: 1. Equivocal gallbladder hyperemia. No gallbladder wall thickening or calcified gallstone. If there is clinical concern for acute cholecystitis, recommend right upper quadrant ultrasound for further evaluation. 2. Hepatomegaly and hepatic steatosis. 3. Small hiatal hernia. Electronically Signed   By: Keith Rake M.D.   On: 11/15/2020 00:13   US ABDOMEN LIMITED RUQ (LIVER/GB)  Result Date: 11/15/2020 CLINICAL DATA:  Right upper quadrant pain with abnormal CT. EXAM: ULTRASOUND ABDOMEN LIMITED RIGHT UPPER QUADRANT COMPARISON:  Abdominopelvic CT yesterday. FINDINGS: Gallbladder: Distended gallbladder with small gallstones. Upper normal wall thickness at 2.9 mm. No pericholecystic fluid. No sonographic Murphy sign noted by sonographer. Common bile duct: Diameter: 6 mm, normal. Liver: Heterogeneously increased in parenchymal echogenicity. Focal fatty sparing adjacent the gallbladder fossa. Portal vein is patent on color Doppler imaging with normal direction of blood flow towards the liver. Other: No right upper quadrant free fluid. IMPRESSION: 1. Gallstones with upper normal gallbladder wall thickness, but no additional findings of acute cholecystitis. 2. No biliary dilatation. 3. Hepatic steatosis. Electronically Signed   By: Keith Rake M.D.   On: 11/15/2020 01:16     ____________________________________________   PROCEDURES  Procedure(s) performed: None Procedures Critical Care performed:  None ____________________________________________   INITIAL IMPRESSION / ASSESSMENT AND PLAN / ED COURSE  60 y.o. female with a history of diabetes, hyperlipidemia,  hysterectomy who presents for evaluation of 2 weeks of intermittent right-sided abdominal pain associated with nausea.  Patient is well-appearing in no distress with normal vital signs, abdomen is soft with right upper quadrant  tenderness with no rebound or guarding and negative Murphy sign.  Ddx gallbladder pathology versus pancreatitis versus gastritis versus peptic ulcer disease versus appendicitis versus diverticulitis versus malignancy.  Labs reviewed by me showing no leukocytosis, no anemia, chemistry panel with hyperglycemia but no evidence of DKA, normal lipase.  UA with mild glycosuria but no evidence of ketones or infection.  CT abdomen pelvis visualized by me with some inflammation of the gallbladder but no other acute findings, confirmed by radiology.  Right upper quadrant is pending.  Will treat with Toradol, fentanyl and Zofran.  Old medical records reviewed.  _________________________ 1:38 AM on 11/15/2020 -----------------------------------------  Ultrasound visualized by me and read by radiologist cholelithiasis with no evidence of cholecystitis.  Patient pain has resolved, she has no tenderness to palpation on exam at this time.  Clinically no signs of acute cholecystitis.  Will refer to Dr. Windell Moment for outpatient follow-up for elective cholecystectomy.  Discussed my standard return precautions and recommended return to the emergency room if patient's pain recurs and lasts for more than an hour, she has a fever.  In the meantime discussed dietary changes including avoiding fatty foods.  _____________________________________________ Please note:  Patient was evaluated in Emergency Department today for the symptoms described in the history of present illness. Patient was evaluated in the context of the global COVID-19 pandemic, which necessitated consideration that the patient might be at risk for infection with the SARS-CoV-2 virus that causes COVID-19. Institutional protocols and algorithms that pertain to the evaluation of patients at risk for COVID-19 are in a state of rapid change based on information released by regulatory bodies including the CDC and federal and state organizations. These  policies and algorithms were followed during the patient's care in the ED.  Some ED evaluations and interventions may be delayed as a result of limited staffing during the pandemic.   [place] Controlled Substance Database was reviewed by me. ____________________________________________   FINAL CLINICAL IMPRESSION(S) / ED DIAGNOSES   Final diagnoses:  RUQ abdominal pain  Calculus of gallbladder without cholecystitis without obstruction      NEW MEDICATIONS STARTED DURING THIS VISIT:  ED Discharge Orders         Ordered    ibuprofen (ADVIL) 600 MG tablet  Every 6 hours PRN        11/15/20 0137    ondansetron (ZOFRAN ODT) 4 MG disintegrating tablet  Every 8 hours PRN        11/15/20 0137           Note:  This document was prepared using Dragon voice recognition software and may include unintentional dictation errors.    Alfred Levins, [place], MD 11/15/20 [phone]

## 2020-11-20 ENCOUNTER — Ambulatory Visit: Payer: 59 | Admitting: General Surgery

## 2020-11-20 ENCOUNTER — Other Ambulatory Visit: Payer: Self-pay

## 2020-11-20 ENCOUNTER — Encounter: Payer: Self-pay | Admitting: General Surgery

## 2020-11-20 ENCOUNTER — Other Ambulatory Visit: Payer: Self-pay | Admitting: Family Medicine

## 2020-11-20 VITALS — BP 106/73 | HR 72 | Temp 96.8°F | Resp 14 | Ht <= 58 in | Wt 144.0 lb

## 2020-11-20 DIAGNOSIS — K8020 Calculus of gallbladder without cholecystitis without obstruction: Secondary | ICD-10-CM | POA: Diagnosis not present

## 2020-11-20 MED ORDER — ONDANSETRON HCL 4 MG PO TABS: 4.0000 mg | ORAL_TABLET | Freq: Three times a day (TID) | ORAL | 0 refills | Status: DC | PRN

## 2020-11-20 MED ORDER — OXYCODONE HCL 5 MG PO TABS: 5.0000 mg | ORAL_TABLET | ORAL | 0 refills | Status: DC | PRN

## 2020-11-20 NOTE — Progress Notes (Signed)
[facility] History and Physical  Reason for Referral: Gallstones  Referring Physician:  ED   Chief Complaint    New Patient (Initial Visit)      Kayla Marks is a 60 y.o. female.  HPI: [name] is here today with her husband who is serving and an interpretor.  she reports having constant pain in the epigastric and RUQ area that started about 4 weeks ago. The pain has been progressively getting worse. She has associated nausea but no vomiting.  She says that she has had some pain in the past with greasy and fatty foods but that those pains went away. This has progressed and she is now unable to really eat anything. She has lost about 8 lbs.   Past Medical History:  Diagnosis Date  . Bell's palsy    history, approx 2011  . Diabetes mellitus    metformin  . Obesity   . Palpitations   . Retinopathy   . Right eye symptoms    "nerve issue"  . Weakness     Past Surgical History:  Procedure Laterality Date  . ABDOMINAL HYSTERECTOMY  1996  . CESAREAN SECTION  1990    Family History  Problem Relation Age of Onset  . Other Father        complications of CVA  . Diabetes Brother   . Other Sister        multiple myeloma    Social History   Tobacco Use  . Smoking status: Never Smoker  . Smokeless tobacco: Never Used  Substance Use Topics  . Alcohol use: No  . Drug use: No    Medications: I have reviewed the patient's current medications. Allergies as of 11/20/2020      Reactions   Insulins Nausea And Vomiting   Unknown insulin, injectible   Penicillins Rash      Medication List       Accurate as of November 20, 2020  3:17 PM. If you have any questions, ask your nurse or doctor.        STOP taking these medications   aspirin 81 MG chewable tablet Stopped by: Virl Cagey, MD   atorvastatin 10 MG tablet Commonly known as: LIPITOR Stopped by: Virl Cagey, MD   ibuprofen 600 MG tablet Commonly known as: ADVIL Stopped by: Virl Cagey, MD   ondansetron 4 MG disintegrating tablet Commonly known as: Zofran ODT Stopped by: Virl Cagey, MD   peg 3350 powder 100 g Solr Commonly known as: MOVIPREP Stopped by: Virl Cagey, MD   pregabalin 50 MG capsule Commonly known as: LYRICA Stopped by: Virl Cagey, MD   Tyler Aas FlexTouch 100 UNIT/ML FlexTouch Pen Generic drug: insulin degludec Stopped by: Virl Cagey, MD     TAKE these medications   metFORMIN 1000 MG tablet Commonly known as: GLUCOPHAGE 1,000 mg daily.        ROS:  A comprehensive review of systems was negative except for: Eyes: positive for blurry vision Gastrointestinal: positive for abdominal pain, nausea and reflux symptoms Musculoskeletal: positive for back pain and neck pain  Blood pressure 106/73, pulse 72, temperature (!) 96.8 F (36 C), temperature source Other (Comment), resp. rate 14, height '4\' 7"'  (1.397 m), weight 144 lb (65.3 kg), SpO2 97 %. Physical Exam Vitals reviewed.  Constitutional:      Appearance: Normal appearance.  HENT:     Head: Normocephalic.     Nose: Nose normal.  Mouth/Throat:     Mouth: Mucous membranes are moist.  Eyes:     Extraocular Movements: Extraocular movements intact.     Pupils: Pupils are equal, round, and reactive to light.  Cardiovascular:     Rate and Rhythm: Normal rate and regular rhythm.  Pulmonary:     Effort: Pulmonary effort is normal.     Breath sounds: Normal breath sounds.  Abdominal:     General: There is no distension.     Palpations: Abdomen is soft.     Tenderness: There is abdominal tenderness in the right upper quadrant and epigastric area.  Musculoskeletal:        General: Normal range of motion.     Cervical back: Normal range of motion.  Skin:    General: Skin is warm and dry.  Neurological:     General: No focal deficit present.     Mental Status: She is alert and oriented to person, place, and time.  Psychiatric:        Mood and Affect:  Mood normal.        Behavior: Behavior normal.        Thought Content: Thought content normal.        Judgment: Judgment normal.     Results: CLINICAL DATA:  Right upper quadrant pain with abnormal CT.  EXAM: ULTRASOUND ABDOMEN LIMITED RIGHT UPPER QUADRANT  COMPARISON:  Abdominopelvic CT yesterday.  FINDINGS: Gallbladder:  Distended gallbladder with small gallstones. Upper normal wall thickness at 2.9 mm. No pericholecystic fluid. No sonographic Murphy sign noted by sonographer.  Common bile duct:  Diameter: 6 mm, normal.  Liver:  Heterogeneously increased in parenchymal echogenicity. Focal fatty sparing adjacent the gallbladder fossa. Portal vein is patent on color Doppler imaging with normal direction of blood flow towards the liver.  Other: No right upper quadrant free fluid.  IMPRESSION: 1. Gallstones with upper normal gallbladder wall thickness, but no additional findings of acute cholecystitis. 2. No biliary dilatation. 3. Hepatic steatosis.   Electronically Signed   By: Keith Rake M.D.   On: 11/15/2020 01:16  Kayla & Plan:  CALLIOPE Marks is a 60 y.o. female with symptomatic cholelithiasis and likely some degree of chronic cholecystitis given the chronicity at this time.   -PLAN: I counseled the patient about the indication, risks and benefits of laparoscopic cholecystectomy.  She understands there is a very small chance for bleeding, infection, injury to normal structures (including common bile duct), conversion to open surgery, persistent symptoms, evolution of postcholecystectomy diarrhea, need for secondary interventions, anesthesia reaction, cardiopulmonary issues and other risks not specifically detailed here. I described the expected recovery, the plan for follow-up and the restrictions during the recovery phase.  All questions were answered.  Discussed preop COVID testing.  All questions were answered to the satisfaction of the  patient and family.     Virl Cagey 11/20/2020, 3:17 PM

## 2020-11-20 NOTE — Patient Instructions (Signed)
Colecistitis Cholecystitis  La colecistitis es la irritacin e hinchazn (inflamacin) de la vescula biliar. La vescula biliar es un rgano que tiene forma de [employer]. Se encuentra debajo del hgado, del lado derecho del cuerpo. Este rgano almacena bilis. La bilis ayuda al cuerpo a descomponer Engineer, drilling) las grasas de los alimentos. Esta afeccin puede presentarse de manera repentina. Es necesario tratarla. Cules son las causas? Esta afeccin puede ser causada por las piedras o bultos que se forman en la vescula biliar (clculos biliares). Los clculos biliares pueden obstruir los conductos (vas biliares) que transportan la bilis desde la vescula biliar. Otras causas son las siguientes:  Dao a la vescula biliar debido a una disminucin del flujo sanguneo.  Microbios en los conductos biliares.  Cicatrices y obstrucciones en los conductos biliares.  Crecimientos anormales (tumores) en el hgado, el pncreas o la vescula biliar. Qu incrementa el riesgo? Es ms probable que contraiga esta afeccin si:  Tiene anemia drepanoctica.  Toma pldoras anticonceptivas.  [place] estrgeno.  Tiene enfermedad heptica por alcoholismo.  Tiene cirrosis en el hgado.  Recibe alimentacin a travs de una vena.  Est muy enfermo.  No come ni bebe durante un tiempo prolongado. Esto se conoce tambin como "ayuno".  Tiene sobrepeso (es obeso).  Pierde peso demasiado rpido.  Est embarazada.  Tiene niveles altos de grasas en la sangre (triglicridos).  Tiene irritacin e hinchazn en el pncreas (pancreatitis). Cules son los signos o sntomas? Los sntomas de esta afeccin incluyen:  Dolor de vientre (abdomen). El dolor suele localizarse en la zona superior derecha del vientre.  Sensibilidad o meteorismo en el vientre.  Ganas de vomitar (nuseas).  Vmitos.  Grant Ruts.  Escalofros. Cmo se diagnostica? Esta afeccin se puede diagnosticar mediante los antecedentes mdicos y  un examen. Tambin pueden hacerle otros estudios, por ejemplo:  Estudios de diagnstico por imgenes. Estos pueden incluir lo siguiente: ? Nurse, adult. ? Exploracin por tomografa computarizada (TC) del vientre. ? Gammagrafa. Tambin se la conoce como gammagrafa hepatobiliar con cido iminodiactico (HIDA). Esta exploracin permite al mdico observar la bilis cuando se mueve en el cuerpo. ? Una resonancia magntica (RM).  Anlisis de [place]. Se realizan para verificar lo siguiente: ? El recuento sanguneo. La cantidad de glbulos blancos puede ser ms elevada de lo normal. ? Cmo funciona el hgado.   Cmo se trata? El tratamiento de esta afeccin puede incluir:  [place] para extirpar la vescula biliar.  Antibiticos para tratar enfermedades causadas por microbios.  Pasar algn tiempo sin comer.  La administracin de lquidos a travs de una tubo (catter) intravenoso.  Medicamentos para tratar Chief Technology Officer o los vmitos. Siga estas indicaciones en su casa:  Si le realizaron [place], siga las indicaciones del mdico acerca de cmo cuidarse cuando vaya a su hogar. Medicamentos  [employer] de venta libre y los recetados solamente como se lo haya indicado el mdico.  Si le recetaron un antibitico, tmelo como se lo haya indicado el mdico. No deje de tomarlo aunque comience a sentirse mejor.   Indicaciones generales  Siga las indicaciones del mdico respecto de las comidas o las bebidas. No coma ni beba nada que le cause malestar nuevamente.  No levante ningn objeto que pese ms de 10libras (4,5kg) hasta que el mdico le diga que es seguro [place].  No consuma ningn producto que contenga nicotina o tabaco, como cigarrillos y Administrator, Civil Service. Si necesita ayuda para dejar de fumar, consulte al mdico.  Concurra a todas las visitas de [address]  se lo haya indicado el mdico. Esto es importante. Comunquese con un mdico si:  Electronics engineer y  los medicamentos no [address].  Grant Ruts. Solicite ayuda de inmediato si:  El dolor se desplaza a los siguientes lugares: ? Environmental manager del vientre. ? La espalda.  Los sntomas no desaparecen.  Aparecen nuevos sntomas. Resumen  La colecistitis es la hinchazn y la irritacin de la vescula biliar.  Esta afeccin puede ser causada por las piedras o bultos que se forman en la vescula biliar (clculos biliares).  Los sntomas frecuentes son dolor en el vientre. Es posible que comience a sentir nuseas y vomite. Tambin puede tener fiebre y escalofros.  Esta afeccin puede tratarse con ciruga para extirpar la vescula biliar. Tambin puede tratarse con medicamentos, ayuno y lquidos a travs de un tubo (catter) intravenoso.  Siga lo que le indiquen acerca de comer y beber. No coma alimentos que le causen malestar nuevamente. Esta informacin no tiene Theme park manager el consejo del mdico. Asegrese de hacerle al mdico cualquier pregunta que tenga. Document Revised: 03/30/2018 Document Reviewed: 03/30/2018 Elsevier Patient Education  2021 Elsevier Inc.    Colecistectoma mnimamente invasiva Minimally Invasive Cholecystectomy Una colecistectoma mnimamente invasiva es una ciruga que se realiza para extirpar la vescula biliar. La vescula biliar es un rgano que tiene forma de pera y se encuentra debajo del hgado, del lado derecho del cuerpo. La vescula biliar almacena bilis, un lquido que ayuda al organismo a digerir las grasas. La colecistectoma se realiza con frecuencia para tratar la inflamacin de la vescula biliar (colecistitis). Esta afeccin normalmente se debe a la acumulacin de clculos biliares (colelitiasis) en la vescula biliar. Estos clculos pueden obstruir el flujo de la bilis, lo que produce inflamacin y Engineer, mining. En los [employer], podr ser [place]. Este procedimiento se realiza a travs de pequeas incisiones en el abdomen,  en lugar de una incisin grande. Tambin se denomina "ciruga laparoscpica". Se introduce un endoscopio delgado que tiene Secretary/administrator (laparoscopio) a travs de una incisin. A travs de las otras incisiones, se introducen los instrumentos quirrgicos. En algunos casos, es posible que [place] mnimamente invasiva deba cambiarse a una [place] realizada a travs de una incisin ms grande. Esta se denomina "ciruga abierta". Informe al mdico acerca de lo siguiente:  Cualquier alergia que tenga.  Todos los [employer] [place], incluidos vitaminas, hierbas, gotas oftlmicas, cremas y [address] de [address].  Cualquier problema previo que usted o algn miembro de su familia hayan tenido con los anestsicos.  Cualquier trastorno de la sangre que tenga.  Cirugas a las que se haya sometido.  Cualquier afeccin mdica que tenga.  Si est embarazada o podra estarlo. Cules son los riesgos? En general, se trata de un procedimiento seguro. Sin embargo, pueden ocurrir complicaciones, por ejemplo:  Infeccin.  Sangrado.  Reacciones alrgicas a los medicamentos.  Daos a las estructuras o los rganos cercanos.  Un clculo que queda en el conducto biliar comn (coldoco). El conducto coldoco transporta la bilis desde la vescula biliar hacia el intestino delgado.  Una filtracin de bilis del conducto cstico que se comprime cuando se extirpa la vescula biliar. Medicamentos Consulte al mdico sobre:  Multimedia programmer o suspender los medicamentos que [place] habitualmente. Esto es muy importante si toma medicamentos para la diabetes o anticoagulantes.  Tomar medicamentos como aspirina e ibuprofeno. Estos medicamentos pueden tener un efecto anticoagulante en la [place]. No tome estos medicamentos a menos que el mdico se lo indique.  Usar medicamentos de venta libre, vitaminas, hierbas y suplementos. Instrucciones generales  Infrmele al mdico antes de la ciruga si se ha resfriado o si tiene  una infeccin.  Planifique para que alguien lo lleve a su casa desde el hospital o la clnica.  Si se ir a su casa inmediatamente despus del procedimiento, pdale a alguien que se quede con usted durante 24horas.  Pregntele al mdico: ? Cmo se Forensic psychologist de la Leisure centre manager. ? Qu medidas se tomarn para evitar una infeccin. Estas medidas pueden incluir:  Rasurar el vello del lugar de la ciruga.  Lavar la piel con un jabn antisptico.  Suministrar antibiticos. Qu ocurre durante el procedimiento?  Le colocarn una va intravenosa en una vena.  Le administrarn uno de los siguientes medicamentos o ambos: ? Un medicamento para ayudar a relajarse (sedante). ? Un medicamento que lo har dormir (anestesia general).  Le colocarn un tubo en la boca para que pueda respirar.  Su cirujano le har varias incisiones pequeas en el abdomen.  El laparoscopio se introducir a travs de una de las pequeas incisiones. La cmara del laparoscopio enviar imgenes a un monitor que se encuentra en el quirfano. Esto permitir a su Pensions consultant del abdomen.  Le inyectarn un gas en el abdomen. Esto expandir el abdomen para que el cirujano tenga ms lugar para Facilities manager.  El resto del instrumental necesario para el procedimiento se introducir a travs de las otras incisiones. Se extirpar la vescula biliar a travs de una de las incisiones.  Se puede examinar el conducto coldoco. Si se encuentran clculos en el conducto coldoco, tal vez deban extirparse.  Despus de la extirpacin de la vescula biliar, se cerrarn las incisiones con puntos (suturas), grapas o goma para cerrar la piel.  Las incisiones pueden cubrirse con una venda (vendaje). El procedimiento puede variar segn el mdico y el hospital.   Ladell Heads ocurre despus del procedimiento?  Le controlarn la presin arterial, la frecuencia cardaca, la frecuencia respiratoria y Air cabin crew de oxgeno en la sangre hasta  que le den el alta del hospital o la clnica.  Le darn analgsicos para Human resources officer, si es necesario.  Si le administraron un sedante durante el procedimiento, puede afectarlo por varias horas. No conduzca ni opere maquinaria hasta que el mdico le indique que es seguro [place]. Resumen  La colecistectoma mnimamente invasiva, tambin llamada colecistectoma laparoscpica, es una ciruga que se realiza para extirpar la vescula biliar a travs de pequeas incisiones.  Informe a su mdico sobre todas las otras afecciones que tenga y [place] todos los medicamentos que est usando para dichas afecciones.  Antes del procedimiento, siga las indicaciones sobre las restricciones en los alimentos y las bebidas y sobre cambiar o suspender medicamentos.  Si le administraron un sedante durante el procedimiento, puede afectarlo por varias horas. No conduzca ni opere maquinaria hasta que el mdico le indique que es seguro [place]. Esta informacin no tiene Theme park manager el consejo del mdico. Asegrese de hacerle al mdico cualquier pregunta que tenga. Document Revised: 10/18/2019 Document Reviewed: 10/18/2019 Elsevier Patient Education  2021 [employer].

## 2020-11-21 ENCOUNTER — Encounter (HOSPITAL_COMMUNITY)
Admission: RE | Admit: 2020-11-21 | Discharge: 2020-11-21 | Disposition: A | Discharge status: Home / Self Care | Payer: 59 | Source: Ambulatory Visit | Attending: General Surgery | Admitting: General Surgery

## 2020-11-21 NOTE — Patient Instructions (Signed)
Kayla Marks  11/21/2020     @PREFPERIOPPHARMACY @   Your procedure is scheduled on  11/26/2020  Report to [facility] at  [facility]  A.M.  Call this number if you have problems the morning of surgery:  [phone]   Remember:  Do not eat or drink after midnight.                          Take these medicines the morning of surgery with A SIP OF WATER Zofran(if needed), oxycodone(if needed).    Do not wear jewelry, make-up or nail polish.  Do not wear lotions, powders, or perfumes, or deodorant. Please brush your teeth.  Do not shave 48 hours prior to surgery.  Men may shave face and neck.  Do not bring valuables to the hospital.  [facility] is not responsible for any belongings or valuables.  Contacts, dentures or bridgework may not be worn into surgery.  Leave your suitcase in the car.  After surgery it may be brought to your room.  For patients admitted to the hospital, discharge time will be determined by your treatment team.  Patients discharged the day of surgery will not be allowed to drive home.   Name and phone number of your driver:   Family   Special instructions:  DO NOT smoke the morning of your procedure.     Bathe with 1/2 bottle of CHG the night before and the morning of your procedure. DO NOT use CHG on your face, hair or genitals. Dry off with a clean towels, put on clean clothes and put clean sheets on your bed to sleep on.  Please read over the following fact sheets that you were given. Anesthesia Post-op Instructions and Care and Recovery After Surgery       Minimally Invasive Cholecystectomy Minimally invasive cholecystectomy is surgery to remove the gallbladder. The gallbladder is a pear-shaped organ that lies beneath the liver on the right side of the body. The gallbladder stores bile, which is a fluid that helps the body digest fats. Cholecystectomy is often done to treat inflammation of the gallbladder (cholecystitis). This condition is usually  caused by a buildup of gallstones (cholelithiasis) in the gallbladder. Gallstones can block the flow of bile, which can result in inflammation and pain. In severe cases, emergency surgery may be required. This procedure is done though small incisions in the abdomen, instead of one large incision. It is also called laparoscopic surgery. A thin scope with a camera (laparoscope) is inserted through one incision. Then surgical instruments are inserted through the other incisions. In some cases, a minimally invasive surgery may need to be changed to a surgery that is done through a larger incision. This is called open surgery. Tell a health care provider about:  Any allergies you have.  All medicines you are taking, including vitamins, herbs, eye drops, creams, and over-the-counter medicines.  Any problems you or family members have had with anesthetic medicines.  Any blood disorders you have.  Any surgeries you have had.  Any medical conditions you have.  Whether you are pregnant or may be pregnant. What are the risks? Generally, this is a safe procedure. However, problems may occur, including:  Infection.  Bleeding.  Allergic reactions to medicines.  Damage to nearby structures or organs.  A stone remaining in the common bile duct. The common bile duct carries bile from the gallbladder into the small intestine.  A  bile leak from the cyst duct that is clipped when your gallbladder is removed. What happens before the procedure? Staying hydrated Follow instructions from your health care provider about hydration, which may include:  Up to 2 hours before the procedure - you may continue to drink clear liquids, such as water, clear fruit juice, black coffee, and plain tea.   Eating and drinking restrictions Follow instructions from your health care provider about eating and drinking, which may include:  8 hours before the procedure - stop eating heavy meals or foods, such as meat, fried  foods, or fatty foods.  6 hours before the procedure - stop eating light meals or foods, such as toast or cereal.  6 hours before the procedure - stop drinking milk or drinks that contain milk.  2 hours before the procedure - stop drinking clear liquids. Medicines Ask your health care provider about:  Changing or stopping your regular medicines. This is especially important if you are taking diabetes medicines or blood thinners.  Taking medicines such as aspirin and ibuprofen. These medicines can thin your blood. Do not take these medicines unless your health care provider tells you to take them.  Taking over-the-counter medicines, vitamins, herbs, and supplements. General instructions  Let your health care provider know if you develop a cold or an infection before surgery.  Plan to have someone take you home from the hospital or clinic.  If you will be going home right after the procedure, plan to have someone with you for 24 hours.  Ask your health care provider: ? How your surgery site will be marked. ? What steps will be taken to help prevent infection. These may include:  Removing hair at the surgery site.  Washing skin with a germ-killing soap.  Taking antibiotic medicine. What happens during the procedure?  An IV will be inserted into one of your veins.  You will be given one or both of the following: ? A medicine to help you relax (sedative). ? A medicine to make you fall asleep (general anesthetic).  A breathing tube will be placed in your mouth.  Your surgeon will make several small incisions in your abdomen.  The laparoscope will be inserted through one of the small incisions. The camera on the laparoscope will send images to a monitor in the operating room. This lets your surgeon see inside your abdomen.  A gas will be pumped into your abdomen. This will expand your abdomen to give the surgeon more room to perform the surgery.  Other tools that are needed  for the procedure will be inserted through the other incisions. The gallbladder will be removed through one of the incisions.  Your common bile duct may be examined. If stones are found in the common bile duct, they may be removed.  After your gallbladder has been removed, the incisions will be closed with stitches (sutures), staples, or skin glue.  Your incisions may be covered with a bandage (dressing). The procedure may vary among health care providers and hospitals.   What happens after the procedure?  Your blood pressure, heart rate, breathing rate, and blood oxygen level will be monitored until you leave the hospital or clinic.  You will be given medicines as needed to control your pain.  If you were given a sedative during the procedure, it can affect you for several hours. Do not drive or operate machinery until your health care provider says that it is safe. Summary  Minimally invasive cholecystectomy, also called  laparoscopic cholecystectomy, is surgery to remove the gallbladder using small incisions.  Tell your health care provider about all the medical conditions you have and all the medicines you are taking for those conditions.  Before the procedure, follow instructions about eating or drinking restrictions and changing or stopping medicines.  If you were given a sedative during the procedure, it can affect you for several hours. Do not drive or operate machinery until your health care provider says that it is safe. This information is not intended to replace advice given to you by your health care provider. Make sure you discuss any questions you have with your health care provider. Document Revised: 07/30/2019 Document Reviewed: 07/30/2019 Elsevier Patient Education  2021 Elsevier Inc. General Anesthesia, Adult, Care After This sheet gives you information about how to care for yourself after your procedure. Your health care provider may also give you more specific  instructions. If you have problems or questions, contact your health care provider. What can I expect after the procedure? After the procedure, the following side effects are common:  Pain or discomfort at the IV site.  Nausea.  Vomiting.  Sore throat.  Trouble concentrating.  Feeling cold or chills.  Feeling weak or tired.  Sleepiness and fatigue.  Soreness and body aches. These side effects can affect parts of the body that were not involved in surgery. Follow these instructions at home: For the time period you were told by your health care provider:  Rest.  Do not participate in activities where you could fall or become injured.  Do not drive or use machinery.  Do not drink alcohol.  Do not take sleeping pills or medicines that cause drowsiness.  Do not make important decisions or sign legal documents.  Do not take care of children on your own.   Eating and drinking  Follow any instructions from your health care provider about eating or drinking restrictions.  When you feel hungry, start by eating small amounts of foods that are soft and easy to digest (bland), such as toast. Gradually return to your regular diet.  Drink enough fluid to keep your urine pale yellow.  If you vomit, rehydrate by drinking water, juice, or clear broth. General instructions  If you have sleep apnea, surgery and certain medicines can increase your risk for breathing problems. Follow instructions from your health care provider about wearing your sleep device: ? Anytime you are sleeping, including during daytime naps. ? While taking prescription pain medicines, sleeping medicines, or medicines that make you drowsy.  Have a responsible adult stay with you for the time you are told. It is important to have someone help care for you until you are awake and alert.  Return to your normal activities as told by your health care provider. Ask your health care provider what activities are safe  for you.  Take over-the-counter and prescription medicines only as told by your health care provider.  If you smoke, do not smoke without supervision.  Keep all follow-up visits as told by your health care provider. This is important. Contact a health care provider if:  You have nausea or vomiting that does not get better with medicine.  You cannot eat or drink without vomiting.  You have pain that does not get better with medicine.  You are unable to pass urine.  You develop a skin rash.  You have a fever.  You have redness around your IV site that gets worse. Get help right away if:  You have difficulty breathing.  You have chest pain.  You have blood in your urine or stool, or you vomit blood. Summary  After the procedure, it is common to have a sore throat or nausea. It is also common to feel tired.  Have a responsible adult stay with you for the time you are told. It is important to have someone help care for you until you are awake and alert.  When you feel hungry, start by eating small amounts of foods that are soft and easy to digest (bland), such as toast. Gradually return to your regular diet.  Drink enough fluid to keep your urine pale yellow.  Return to your normal activities as told by your health care provider. Ask your health care provider what activities are safe for you. This information is not intended to replace advice given to you by your health care provider. Make sure you discuss any questions you have with your health care provider. Document Revised: 07/10/2020 Document Reviewed: 02/07/2020 Elsevier Patient Education  2021 [employer].

## 2020-11-25 ENCOUNTER — Other Ambulatory Visit: Payer: Self-pay

## 2020-11-25 ENCOUNTER — Other Ambulatory Visit (HOSPITAL_COMMUNITY)
Admission: RE | Admit: 2020-11-25 | Discharge: 2020-11-25 | Disposition: A | Discharge status: Home / Self Care | Payer: 59 | Source: Ambulatory Visit | Attending: General Surgery | Admitting: General Surgery

## 2020-11-25 DIAGNOSIS — Z01812 Encounter for preprocedural laboratory examination: Secondary | ICD-10-CM | POA: Diagnosis present

## 2020-11-25 DIAGNOSIS — Z20822 Contact with and (suspected) exposure to covid-19: Secondary | ICD-10-CM | POA: Diagnosis not present

## 2020-11-25 LAB — SARS CORONAVIRUS 2 (TAT 6-24 HRS): SARS Coronavirus 2: NEGATIVE

## 2020-11-25 NOTE — H&P (Signed)
[facility] History and Physical  Reason for Referral: Gallstones  Referring Physician:  ED   Chief Complaint    New Marks (Initial Visit)      Kayla Marks is a 60 y.o. female.  HPI: Kayla Marks is here today with her husband who is serving and an interpretor.  she reports having constant pain in Kayla epigastric and RUQ area that started about 4 weeks ago. Kayla pain has been progressively getting worse. She has associated nausea but no vomiting.  She says that she has had some pain in Kayla past with greasy and fatty foods but that those pains went away. This has progressed and she is now unable to really eat anything. She has lost about 8 lbs.   Past Medical History:  Diagnosis Date  . Bell's palsy    history, approx 2011  . Diabetes mellitus    metformin  . Obesity   . Palpitations   . Retinopathy   . Right eye symptoms    "nerve issue"  . Weakness     Past Surgical History:  Procedure Laterality Date  . ABDOMINAL HYSTERECTOMY  1996  . CESAREAN SECTION  1990    Family History  Problem Relation Age of Onset  . Other Father        complications of CVA  . Diabetes Brother   . Other Sister        multiple myeloma    Social History   Tobacco Use  . Smoking status: Never Smoker  . Smokeless tobacco: Never Used  Substance Use Topics  . Alcohol use: No  . Drug use: No    Medications: I have reviewed Kayla Marks's current medications. Allergies as of 11/20/2020      Reactions   Insulins Nausea And Vomiting   Unknown insulin, injectible   Penicillins Rash      Medication List       Accurate as of November 20, 2020  3:17 PM. If you have any questions, ask your nurse or doctor.        STOP taking these medications   aspirin 81 MG chewable tablet Stopped by: Virl Cagey, MD   atorvastatin 10 MG tablet Commonly known as: LIPITOR Stopped by: Virl Cagey, MD   ibuprofen 600 MG tablet Commonly known as: ADVIL Stopped by: Virl Cagey, MD   ondansetron 4 MG disintegrating tablet Commonly known as: Zofran ODT Stopped by: Virl Cagey, MD   peg 3350 powder 100 g Solr Commonly known as: MOVIPREP Stopped by: Virl Cagey, MD   pregabalin 50 MG capsule Commonly known as: LYRICA Stopped by: Virl Cagey, MD   Tyler Aas FlexTouch 100 UNIT/ML FlexTouch Pen Generic drug: insulin degludec Stopped by: Virl Cagey, MD     TAKE these medications   metFORMIN 1000 MG tablet Commonly known as: GLUCOPHAGE 1,000 mg daily.        ROS:  A comprehensive review of systems was negative except for: Eyes: positive for blurry vision Gastrointestinal: positive for abdominal pain, nausea and reflux symptoms Musculoskeletal: positive for back pain and neck pain  Blood pressure 106/73, pulse 72, temperature (!) 96.8 F (36 C), temperature source Other (Comment), resp. rate 14, height '4\' 7"'  (1.397 m), weight 144 lb (65.3 kg), SpO2 97 %. Physical Exam Vitals reviewed.  Constitutional:      Appearance: Normal appearance.  HENT:     Head: Normocephalic.     Nose: Nose normal.  Mouth/Throat:     Mouth: Mucous membranes are moist.  Eyes:     Extraocular Movements: Extraocular movements intact.     Pupils: Pupils are equal, round, and reactive to light.  Cardiovascular:     Rate and Rhythm: Normal rate and regular rhythm.  Pulmonary:     Effort: Pulmonary effort is normal.     Breath sounds: Normal breath sounds.  Abdominal:     General: There is no distension.     Palpations: Abdomen is soft.     Tenderness: There is abdominal tenderness in Kayla right upper quadrant and epigastric area.  Musculoskeletal:        General: Normal range of motion.     Cervical back: Normal range of motion.  Skin:    General: Skin is warm and dry.  Neurological:     General: No focal deficit present.     Mental Status: She is alert and oriented to person, place, and time.  Psychiatric:        Mood and Affect:  Mood normal.        Behavior: Behavior normal.        Thought Content: Thought content normal.        Judgment: Judgment normal.     Results: CLINICAL DATA:  Right upper quadrant pain with abnormal CT.  EXAM: ULTRASOUND ABDOMEN LIMITED RIGHT UPPER QUADRANT  COMPARISON:  Abdominopelvic CT yesterday.  FINDINGS: Gallbladder:  Distended gallbladder with small gallstones. Upper normal wall thickness at 2.9 mm. No pericholecystic fluid. No sonographic Murphy sign noted by sonographer.  Common bile duct:  Diameter: 6 mm, normal.  Liver:  Heterogeneously increased in parenchymal echogenicity. Focal fatty sparing adjacent Kayla gallbladder fossa. Portal vein is patent on color Doppler imaging with normal direction of blood flow towards Kayla liver.  Other: No right upper quadrant free fluid.  IMPRESSION: 1. Gallstones with upper normal gallbladder wall thickness, but no additional findings of acute cholecystitis. 2. No biliary dilatation. 3. Hepatic steatosis.   Electronically Signed   By: Keith Rake M.D.   On: 11/15/2020 01:16  Assessment & Plan:  [name] is a 60 y.o. female with symptomatic cholelithiasis and likely some degree of chronic cholecystitis given Kayla chronicity at this time.   -PLAN: I counseled Kayla Marks about Kayla indication, risks and benefits of laparoscopic cholecystectomy.  She understands there is a very small chance for bleeding, infection, injury to normal structures (including common bile duct), conversion to open surgery, persistent symptoms, evolution of postcholecystectomy diarrhea, need for secondary interventions, anesthesia reaction, cardiopulmonary issues and other risks not specifically detailed here. I described Kayla expected recovery, Kayla plan for follow-up and Kayla restrictions during Kayla recovery phase.  All questions were answered.  Discussed preop COVID testing.  All questions were answered to Kayla satisfaction of Kayla  Marks and family.     Virl Cagey 11/20/2020, 3:17 PM

## 2020-11-26 ENCOUNTER — Ambulatory Visit (HOSPITAL_COMMUNITY)
Admission: RE | Admit: 2020-11-26 | Discharge: 2020-11-26 | Disposition: A | Discharge status: Home / Self Care | Payer: 59 | Attending: General Surgery | Admitting: General Surgery

## 2020-11-26 ENCOUNTER — Ambulatory Visit (HOSPITAL_COMMUNITY): Payer: 59 | Admitting: Anesthesiology

## 2020-11-26 ENCOUNTER — Encounter (HOSPITAL_COMMUNITY)
Admission: RE | Disposition: A | Discharge status: Home / Self Care | Payer: Self-pay | Source: Home / Self Care | Attending: General Surgery

## 2020-11-26 ENCOUNTER — Encounter (HOSPITAL_COMMUNITY): Payer: Self-pay | Admitting: General Surgery

## 2020-11-26 DIAGNOSIS — Z7984 Long term (current) use of oral hypoglycemic drugs: Secondary | ICD-10-CM | POA: Insufficient documentation

## 2020-11-26 DIAGNOSIS — K8020 Calculus of gallbladder without cholecystitis without obstruction: Secondary | ICD-10-CM | POA: Diagnosis present

## 2020-11-26 DIAGNOSIS — Z888 Allergy status to other drugs, medicaments and biological substances status: Secondary | ICD-10-CM | POA: Diagnosis not present

## 2020-11-26 DIAGNOSIS — Z880 Allergy status to penicillin: Secondary | ICD-10-CM | POA: Insufficient documentation

## 2020-11-26 DIAGNOSIS — K8010 Calculus of gallbladder with chronic cholecystitis without obstruction: Secondary | ICD-10-CM | POA: Insufficient documentation

## 2020-11-26 HISTORY — PX: CHOLECYSTECTOMY: SHX55

## 2020-11-26 LAB — GLUCOSE, CAPILLARY
Glucose-Capillary: 281 mg/dL — ABNORMAL HIGH (ref 70–99)
Glucose-Capillary: 314 mg/dL — ABNORMAL HIGH (ref 70–99)

## 2020-11-26 SURGERY — LAPAROSCOPIC CHOLECYSTECTOMY
Anesthesia: General

## 2020-11-26 MED ORDER — MEPERIDINE HCL 50 MG/ML IJ SOLN
6.2500 mg | INTRAMUSCULAR | Status: DC | PRN
Start: 2020-11-26 — End: 2020-11-26

## 2020-11-26 MED ORDER — OXYCODONE HCL 5 MG PO TABS
5.0000 mg | ORAL_TABLET | Freq: Once | ORAL | Status: AC
  Administered 2020-11-26: 5 mg via ORAL

## 2020-11-26 MED ORDER — ONDANSETRON HCL 4 MG/2ML IJ SOLN
INTRAMUSCULAR | Status: DC | PRN
  Administered 2020-11-26: 4 mg via INTRAVENOUS

## 2020-11-26 MED ORDER — METFORMIN HCL 500 MG PO TABS
1000.0000 mg | ORAL_TABLET | Freq: Once | ORAL | Status: AC
  Administered 2020-11-26: 1000 mg via ORAL
  Filled 2020-11-26: qty 2

## 2020-11-26 MED ORDER — FENTANYL CITRATE (PF) 100 MCG/2ML IJ SOLN
INTRAMUSCULAR | Status: AC
  Filled 2020-11-26: qty 2

## 2020-11-26 MED ORDER — ORAL CARE MOUTH RINSE: 15.0000 mL | Freq: Once | OROMUCOSAL | Status: AC

## 2020-11-26 MED ORDER — ROCURONIUM BROMIDE 10 MG/ML (PF) SYRINGE
PREFILLED_SYRINGE | INTRAVENOUS | Status: AC
  Filled 2020-11-26: qty 10

## 2020-11-26 MED ORDER — KETOROLAC TROMETHAMINE 30 MG/ML IJ SOLN
INTRAMUSCULAR | Status: AC
  Filled 2020-11-26: qty 1

## 2020-11-26 MED ORDER — BUPIVACAINE HCL (PF) 0.5 % IJ SOLN
INTRAMUSCULAR | Status: DC | PRN
  Administered 2020-11-26: 20 mL

## 2020-11-26 MED ORDER — HYDROMORPHONE HCL 1 MG/ML IJ SOLN
0.2500 mg | INTRAMUSCULAR | Status: DC | PRN
  Administered 2020-11-26 (×2): 0.5 mg via INTRAVENOUS
  Filled 2020-11-26 (×2): qty 0.5

## 2020-11-26 MED ORDER — OXYCODONE HCL 5 MG PO TABS: 5.0000 mg | ORAL_TABLET | ORAL | 0 refills | Status: DC | PRN

## 2020-11-26 MED ORDER — ONDANSETRON HCL 4 MG/2ML IJ SOLN
INTRAMUSCULAR | Status: AC
  Filled 2020-11-26: qty 2

## 2020-11-26 MED ORDER — MIDAZOLAM HCL 2 MG/2ML IJ SOLN
INTRAMUSCULAR | Status: AC
  Filled 2020-11-26: qty 2

## 2020-11-26 MED ORDER — SUCCINYLCHOLINE CHLORIDE 20 MG/ML IJ SOLN
INTRAMUSCULAR | Status: DC | PRN
  Administered 2020-11-26: 100 mg via INTRAVENOUS

## 2020-11-26 MED ORDER — LIDOCAINE HCL (PF) 2 % IJ SOLN
INTRAMUSCULAR | Status: AC
  Filled 2020-11-26: qty 5

## 2020-11-26 MED ORDER — PROMETHAZINE HCL 25 MG/ML IJ SOLN: 6.2500 mg | INTRAMUSCULAR | Status: DC | PRN

## 2020-11-26 MED ORDER — CHLORHEXIDINE GLUCONATE CLOTH 2 % EX PADS: 6.0000 | MEDICATED_PAD | Freq: Once | CUTANEOUS | Status: DC

## 2020-11-26 MED ORDER — PROPOFOL 10 MG/ML IV BOLUS
INTRAVENOUS | Status: AC
  Filled 2020-11-26: qty 20

## 2020-11-26 MED ORDER — CIPROFLOXACIN IN D5W 400 MG/200ML IV SOLN
INTRAVENOUS | Status: AC
  Filled 2020-11-26: qty 200

## 2020-11-26 MED ORDER — CIPROFLOXACIN IN D5W 400 MG/200ML IV SOLN
400.0000 mg | INTRAVENOUS | Status: AC
  Administered 2020-11-26: 400 mg via INTRAVENOUS

## 2020-11-26 MED ORDER — DEXAMETHASONE SODIUM PHOSPHATE 4 MG/ML IJ SOLN
INTRAMUSCULAR | Status: DC | PRN
  Administered 2020-11-26: 5 mg via INTRAVENOUS

## 2020-11-26 MED ORDER — BUPIVACAINE HCL (PF) 0.5 % IJ SOLN
INTRAMUSCULAR | Status: AC
  Filled 2020-11-26: qty 30

## 2020-11-26 MED ORDER — SUCCINYLCHOLINE CHLORIDE 200 MG/10ML IV SOSY
PREFILLED_SYRINGE | INTRAVENOUS | Status: AC
  Filled 2020-11-26: qty 10

## 2020-11-26 MED ORDER — ONDANSETRON HCL 4 MG PO TABS: 4.0000 mg | ORAL_TABLET | Freq: Three times a day (TID) | ORAL | 0 refills | Status: DC | PRN

## 2020-11-26 MED ORDER — SUGAMMADEX SODIUM 200 MG/2ML IV SOLN
INTRAVENOUS | Status: DC | PRN
Start: 2020-11-26 — End: 2020-11-26
  Administered 2020-11-26: 200 mg via INTRAVENOUS

## 2020-11-26 MED ORDER — PROPOFOL 10 MG/ML IV BOLUS
INTRAVENOUS | Status: DC | PRN
  Administered 2020-11-26: 120 mg via INTRAVENOUS

## 2020-11-26 MED ORDER — CHLORHEXIDINE GLUCONATE 0.12 % MT SOLN
15.0000 mL | Freq: Once | OROMUCOSAL | Status: AC
  Administered 2020-11-26: 15 mL via OROMUCOSAL

## 2020-11-26 MED ORDER — OXYCODONE HCL 5 MG PO TABS
ORAL_TABLET | ORAL | Status: AC
  Filled 2020-11-26: qty 1

## 2020-11-26 MED ORDER — SODIUM CHLORIDE 0.9 % IR SOLN
Status: DC | PRN
  Administered 2020-11-26: 1000 mL

## 2020-11-26 MED ORDER — ROCURONIUM BROMIDE 10 MG/ML (PF) SYRINGE
PREFILLED_SYRINGE | INTRAVENOUS | Status: DC | PRN
  Administered 2020-11-26: 40 mg via INTRAVENOUS

## 2020-11-26 MED ORDER — DEXAMETHASONE SODIUM PHOSPHATE 10 MG/ML IJ SOLN
INTRAMUSCULAR | Status: AC
  Filled 2020-11-26: qty 1

## 2020-11-26 MED ORDER — HEMOSTATIC AGENTS (NO CHARGE) OPTIME
TOPICAL | Status: DC | PRN
  Administered 2020-11-26: 1 via TOPICAL

## 2020-11-26 MED ORDER — KETOROLAC TROMETHAMINE 30 MG/ML IJ SOLN
INTRAMUSCULAR | Status: DC | PRN
  Administered 2020-11-26: 30 mg via INTRAVENOUS

## 2020-11-26 MED ORDER — FENTANYL CITRATE (PF) 100 MCG/2ML IJ SOLN
INTRAMUSCULAR | Status: DC | PRN
  Administered 2020-11-26 (×2): 50 ug via INTRAVENOUS

## 2020-11-26 MED ORDER — LACTATED RINGERS IV SOLN
INTRAVENOUS | Status: DC
  Administered 2020-11-26: 1000 mL via INTRAVENOUS

## 2020-11-26 MED ORDER — MIDAZOLAM HCL 5 MG/5ML IJ SOLN
INTRAMUSCULAR | Status: DC | PRN
  Administered 2020-11-26: 2 mg via INTRAVENOUS

## 2020-11-26 MED ORDER — OXYCODONE-ACETAMINOPHEN 5-325 MG PO TABS
ORAL_TABLET | ORAL | Status: AC
  Filled 2020-11-26: qty 1

## 2020-11-26 SURGICAL SUPPLY — 48 items
ADH SKN CLS APL DERMABOND .7 (GAUZE/BANDAGES/DRESSINGS) ×1
APL PRP STRL LF DISP 70% ISPRP (MISCELLANEOUS) ×1
APPLIER CLIP ROT 10 11.4 M/L (STAPLE) ×2
APR CLP MED LRG 11.4X10 (STAPLE) ×1
BAG RETRIEVAL 10 (BASKET) ×1
BLADE SURG 15 STRL LF DISP TIS (BLADE) ×1 IMPLANT
BLADE SURG 15 STRL SS (BLADE) ×2
CHLORAPREP W/TINT 26 (MISCELLANEOUS) ×2 IMPLANT
CLIP APPLIE ROT 10 11.4 M/L (STAPLE) ×1 IMPLANT
CLOTH BEACON ORANGE TIMEOUT ST (SAFETY) ×2 IMPLANT
COVER LIGHT HANDLE STERIS (MISCELLANEOUS) ×4 IMPLANT
COVER WAND RF STERILE (DRAPES) ×2 IMPLANT
DECANTER SPIKE VIAL GLASS SM (MISCELLANEOUS) ×2 IMPLANT
DERMABOND ADVANCED (GAUZE/BANDAGES/DRESSINGS) ×1
DERMABOND ADVANCED .7 DNX12 (GAUZE/BANDAGES/DRESSINGS) ×1 IMPLANT
ELECT REM PT RETURN 9FT ADLT (ELECTROSURGICAL) ×2
ELECTRODE REM PT RTRN 9FT ADLT (ELECTROSURGICAL) ×1 IMPLANT
GLOVE BIO SURGEON STRL SZ 6.5 (GLOVE) ×2 IMPLANT
GLOVE BIOGEL M 6.5 STRL (GLOVE) ×1 IMPLANT
GLOVE BIOGEL PI IND STRL 6 (GLOVE) IMPLANT
GLOVE BIOGEL PI IND STRL 6.5 (GLOVE) ×1 IMPLANT
GLOVE BIOGEL PI IND STRL 7.0 (GLOVE) ×3 IMPLANT
GLOVE BIOGEL PI INDICATOR 6 (GLOVE) ×1
GLOVE BIOGEL PI INDICATOR 6.5 (GLOVE) ×1
GLOVE BIOGEL PI INDICATOR 7.0 (GLOVE) ×3
GLOVE ECLIPSE 7.0 STRL STRAW (GLOVE) ×1 IMPLANT
GOWN STRL REUS W/TWL LRG LVL3 (GOWN DISPOSABLE) ×6 IMPLANT
HEMOSTAT SNOW SURGICEL 2X4 (HEMOSTASIS) ×2 IMPLANT
INST SET LAPROSCOPIC AP (KITS) ×2 IMPLANT
KIT TURNOVER KIT A (KITS) ×2 IMPLANT
MANIFOLD NEPTUNE II (INSTRUMENTS) ×2 IMPLANT
NDL INSUFFLATION 14GA 120MM (NEEDLE) ×1 IMPLANT
NEEDLE INSUFFLATION 14GA 120MM (NEEDLE) ×2 IMPLANT
NS IRRIG 1000ML POUR BTL (IV SOLUTION) ×2 IMPLANT
PACK LAP CHOLE LZT030E (CUSTOM PROCEDURE TRAY) ×2 IMPLANT
PAD ARMBOARD 7.5X6 YLW CONV (MISCELLANEOUS) ×2 IMPLANT
SET BASIN LINEN APH (SET/KITS/TRAYS/PACK) ×2 IMPLANT
SET TUBE SMOKE EVAC HIGH FLOW (TUBING) ×2 IMPLANT
SLEEVE ENDOPATH XCEL 5M (ENDOMECHANICALS) ×2 IMPLANT
SUT MNCRL AB 4-0 PS2 18 (SUTURE) ×4 IMPLANT
SUT VICRYL 0 UR6 27IN ABS (SUTURE) ×2 IMPLANT
SYS BAG RETRIEVAL 10MM (BASKET) ×1
SYSTEM BAG RETRIEVAL 10MM (BASKET) ×1 IMPLANT
TROCAR ENDO BLADELESS 11MM (ENDOMECHANICALS) ×2 IMPLANT
TROCAR XCEL NON-BLD 5MMX100MML (ENDOMECHANICALS) ×2 IMPLANT
TROCAR XCEL UNIV SLVE 11M 100M (ENDOMECHANICALS) ×2 IMPLANT
TUBE CONNECTING 12X1/4 (SUCTIONS) ×2 IMPLANT
WARMER LAPAROSCOPE (MISCELLANEOUS) ×2 IMPLANT

## 2020-11-26 NOTE — Progress Notes (Signed)
[facility]  Updated daughter. Will do post op phone call in 2 weeks. Rx sent to [facility]  ,Algis Greenhouse, MD [facility] [address] [place], [place] [address] [phone] (office)

## 2020-11-26 NOTE — Anesthesia Preprocedure Evaluation (Addendum)
Anesthesia Evaluation  Patient identified by MRN, date of birth, ID band Patient awake    Reviewed: Allergy & Precautions, NPO status , Patient's Chart, lab work & pertinent test results  History of Anesthesia Complications Negative for: history of anesthetic complications  Airway Mallampati: II  TM Distance: >3 FB Neck ROM: Full    Dental  (+) Dental Advisory Given, Caps   Pulmonary neg pulmonary ROS,    Pulmonary exam normal breath sounds clear to auscultation       Cardiovascular Exercise Tolerance: Good negative cardio ROS Normal cardiovascular exam Rhythm:Regular Rate:Normal     Neuro/Psych  Neuromuscular disease (bell's palsy) negative psych ROS   GI/Hepatic negative GI ROS, Neg liver ROS,   Endo/Other  diabetes, Well Controlled, Type 2, Oral Hypoglycemic Agents  Renal/GU negative Renal ROS  negative genitourinary   Musculoskeletal negative musculoskeletal ROS (+)   Abdominal   Peds negative pediatric ROS (+)  Hematology negative hematology ROS (+)   Anesthesia Other Findings   Reproductive/Obstetrics negative OB ROS                            Anesthesia Physical Anesthesia Plan  ASA: II  Anesthesia Plan: General   Post-op Pain Management:    Induction: Intravenous  PONV Risk Score and Plan: 4 or greater and Ondansetron, Midazolam and Dexamethasone  Airway Management Planned: Oral ETT  Additional Equipment:   Intra-op Plan:   Post-operative Plan: Extubation in OR  Informed Consent: I have reviewed the patients History and Physical, chart, labs and discussed the procedure including the risks, benefits and alternatives for the proposed anesthesia with the patient or authorized representative who has indicated his/her understanding and acceptance.     Dental advisory given  Plan Discussed with: CRNA and Surgeon  Anesthesia Plan Comments:        Anesthesia  Quick Evaluation

## 2020-11-26 NOTE — Anesthesia Postprocedure Evaluation (Signed)
Anesthesia Post Note  Patient: Kayla Marks  Procedure(s) Performed: LAPAROSCOPIC CHOLECYSTECTOMY (N/A )  Patient location during evaluation: PACU Anesthesia Type: General Level of consciousness: awake, patient cooperative and responds to stimulation Pain management: pain level controlled Vital Signs Assessment: post-procedure vital signs reviewed and stable Respiratory status: spontaneous breathing, respiratory function stable and nonlabored ventilation Cardiovascular status: blood pressure returned to baseline and stable Postop Assessment: no headache and no backache Anesthetic complications: no   No complications documented.   Last Vitals:  Vitals:   11/26/20 0705  BP: 127/74  Resp: 15  Temp: 36.5 C  SpO2: 98%    Last Pain:  Vitals:   11/26/20 0705  TempSrc: Oral  PainSc: 8                  Brynda Peon

## 2020-11-26 NOTE — Op Note (Signed)
Operative Note   Preoperative Diagnosis: Symptomatic cholelithiasis   Postoperative Diagnosis: Same   Procedure(s) Performed: Laparoscopic cholecystectomy   Surgeon: Lillia Abed C. Henreitta Leber, MD   Assistants: Franky Macho, MD    Anesthesia: General endotracheal   Anesthesiologist: Dr. Alva Garnet    Specimens: Gallbladder    Estimated Blood Loss: Minimal    Blood Replacement: None    Complications: None    Operative Findings:  Distended gallbladder with stones   Procedure: The patient was taken to the operating room and placed supine. General endotracheal anesthesia was induced. Intravenous antibiotics were administered per protocol. An orogastric tube positioned to decompress the stomach. The abdomen was prepared and draped in the usual sterile fashion.    A supraumbilical incision was made and a Veress technique was utilized to achieve pneumoperitoneum to 15 mmHg with carbon dioxide. A 11 mm optiview port was placed through the supraumbilical region, and a 10 mm 0-degree operative laparoscope was introduced. The area underlying the trocar and Veress needle were inspected and without evidence of injury.  Remaining trocars were placed under direct vision. Two 5 mm ports were placed in the right abdomen, between the anterior axillary and midclavicular line.  A final 11 mm port was placed through the mid-epigastrium, near the falciform ligament.    The gallbladder fundus was elevated cephalad and the infundibulum was retracted to the patient's right. The gallbladder/cystic duct junction was skeletonized. The cystic artery noted in the triangle of Calot and was also skeletonized.  We then continued liberal medial and lateral dissection until the critical view of safety was achieved.  The gallbladder was entered and bile decompressed.    The cystic duct and cystic artery were triply clipped and divided. The gallbladder was then dissected from the liver bed with electrocautery. The specimen was  placed in an Endopouch and was retrieved through the epigastric site.   Final inspection revealed acceptable hemostasis. Surgical SNOW was placed in the gallbladder bed.  Trocars were removed and pneumoperitoneum was released.  The epigastric and umbilical port sites were smaller than my finger. Skin incisions were closed with 4-0 Monocryl subcuticular sutures and Dermabond. The patient was awakened from anesthesia and extubated without complication.    Algis Greenhouse, MD [facility] [address] [place], [place] [address] [phone] (office)

## 2020-11-26 NOTE — Progress Notes (Signed)
In talking with pt re if she has been taking her home meds for diabetes she states she has not ben taking her tresiba as insurance will no longer pay for it.

## 2020-11-26 NOTE — Interval H&P Note (Signed)
History and Physical Interval Note:  11/26/2020 7:18 AM  Kayla Marks  has presented today for surgery, with the diagnosis of Cholelithiasis.  The various methods of treatment have been discussed with the patient and family. After consideration of risks, benefits and other options for treatment, the patient has consented to  Procedure(s): LAPAROSCOPIC CHOLECYSTECTOMY (N/A) as a surgical intervention.  The patient's history has been reviewed, patient examined, no change in status, stable for surgery.  I have reviewed the patient's chart and labs.  Questions were answered to the patient's satisfaction.    No changes.   Lucretia Roers

## 2020-11-26 NOTE — Anesthesia Procedure Notes (Signed)
Procedure Name: Intubation Performed by: Brynda Peon, CRNA Pre-anesthesia Checklist: Patient identified, Emergency Drugs available, Suction available, Patient being monitored and Timeout performed Patient Re-evaluated:Patient Re-evaluated prior to induction Oxygen Delivery Method: Circle system utilized Preoxygenation: Pre-oxygenation with 100% oxygen Induction Type: IV induction Laryngoscope Size: Miller and 2 Grade View: Grade I Tube size: 7.0 mm Number of attempts: 1 Airway Equipment and Method: Stylet Placement Confirmation: ETT inserted through vocal cords under direct vision,  positive ETCO2,  CO2 detector and breath sounds checked- equal and bilateral Secured at: 20 cm Tube secured with: Tape

## 2020-11-26 NOTE — Transfer of Care (Signed)
Immediate Anesthesia Transfer of Care Note  Patient: Kayla Marks  Procedure(s) Performed: LAPAROSCOPIC CHOLECYSTECTOMY (N/A )  Patient Location: PACU  Anesthesia Type:General  Level of Consciousness: awake, drowsy and patient cooperative  Airway & Oxygen Therapy: Patient Spontanous Breathing  Post-op Assessment: Report given to RN, Post -op Vital signs reviewed and stable and Patient moving all extremities  Post vital signs: Reviewed and stable  Last Vitals:  Vitals Value Taken Time  BP 137/69 11/26/20 0826  Temp    Pulse 77 11/26/20 0827  Resp 30 11/26/20 0827  SpO2 95 % 11/26/20 0827  Vitals shown include unvalidated device data.  Last Pain:  Vitals:   11/26/20 0705  TempSrc: Oral  PainSc: 8       Patients Stated Pain Goal: 9 (11/26/20 0705)  Complications: No complications documented.

## 2020-11-26 NOTE — Discharge Instructions (Signed)
Discharge Laparoscopic Surgery Instructions:  Common Complaints: Right shoulder pain is common after laparoscopic surgery. This is secondary to the gas used in the surgery being trapped under the diaphragm.  Walk to help your body absorb the gas. This will improve in a few days. Pain at the port sites are common, especially the larger port sites. This will improve with time.  Some nausea is common and poor appetite. The main goal is to stay hydrated the first few days after surgery.   Diet/ Activity: Diet as tolerated. You may not have an appetite, but it is important to stay hydrated. Drink 64 ounces of water a day. Your appetite will return with time.  Shower per your regular routine daily.  Do not take hot showers. Take warm showers that are less than 10 minutes. Rest and listen to your body, but do not remain in bed all day.  Walk everyday for at least 15-20 minutes. Deep cough and move around every 1-2 hours in the first few days after surgery.  Do not lift > 10 lbs, perform excessive bending, pushing, pulling, squatting for 1-2 weeks after surgery.  Do not pick at the dermabond glue on your incision sites.  This glue film will remain in place for 1-2 weeks and will start to peel off.  Do not place lotions or balms on your incision unless instructed to specifically by Dr. Bridges.   Pain Expectations and Narcotics: -After surgery you will have pain associated with your incisions and this is normal. The pain is muscular and nerve pain, and will get better with time. -You are encouraged and expected to take non narcotic medications like tylenol and ibuprofen (when able) to treat pain as multiple modalities can aid with pain treatment. -Narcotics are only used when pain is severe or there is breakthrough pain. -You are not expected to have a pain score of 0 after surgery, as we cannot prevent pain. A pain score of 3-4 that allows you to be functional, move, walk, and tolerate some activity is  the goal. The pain will continue to improve over the days after surgery and is dependent on your surgery. -Due to [place] law, we are only able to give a certain amount of pain medication to treat post operative pain, and we only give additional narcotics on a patient by patient basis.  -For most laparoscopic surgery, studies have shown that the majority of patients only need 10-15 narcotic pills, and for open surgeries most patients only need 15-20.   -Having appropriate expectations of pain and knowledge of pain management with non narcotics is important as we do not want anyone to become addicted to narcotic pain medication.  -Using ice packs in the first 48 hours and heating pads after 48 hours, wearing an abdominal binder (when recommended), and using over the counter medications are all ways to help with pain management.   -Simple acts like meditation and mindfulness practices after surgery can also help with pain control and research has proven the benefit of these practices.  Medication: Take tylenol and ibuprofen as needed for pain control, alternating every 4-6 hours.  Example:  Tylenol 1000mg @ 6am, 12noon, 6pm, 12midnight (Do not exceed 4000mg of tylenol a day). Ibuprofen 800mg @ 9am, 3pm, 9pm, 3am (Do not exceed 3600mg of ibuprofen a day).  Take Roxicodone for breakthrough pain every 4 hours.  Take Colace for constipation related to narcotic pain medication. If you do not have a bowel movement in 2 days, take Miralax   over the counter.  Drink plenty of water to also prevent constipation.   Contact Information: If you have questions or concerns, please call our office, [phone], Monday- Thursday 8AM-5PM and Friday 8AM-12Noon.  If it is after hours or on the weekend, please call [facility], [phone], and ask to speak to the surgeon on call [facility].   Instrucciones de [place] Laparoscpica de [place]:  Quejas comunes: El dolor en el hombro derecho es comn  despus de la ciruga laparoscpica. Esto es secundario a que el gas utilizado en la ciruga queda atrapado debajo del diafragma. Camine para ayudar a su cuerpo a absorber el gas. Esto mejorar en The Mutual of Omahaunos das. El dolor en los sitios de los puertos es comn, Haematologistespecialmente en los sitios de los puertos ms grandes. Esto mejorar con Allied Waste Industriesel tiempo. Algunas nuseas son comunes y falta de apetito. El objetivo principal es mantenerse hidratado los primeros das despus de la [place].  Dieta/ Actividad: Dieta segn tolerancia. Es posible que no tenga apetito, pero es importante mantenerse hidratado. Beba 64 onzas de agua al C.H. Robinson Worldwideda. Su apetito volver con Museum/gallery conservatorel tiempo. Dchese segn su rutina habitual todos los [place]. No tome duchas calientes. Tome duchas calientes que duren menos de 10 minutos. Descansa y escucha a tu cuerpo, pero no te quedes en la cama todo Medical laboratory scientific officerel da. Camine todos los [address] [facility] [address] durante al menos 15-20 minutos. Tos profunda y moverse cada 1-2 horas en los primeros das despus de la [place]. No levante ms de 10 libras, realice flexiones excesivas, empuje, jale o se ponga en cuclillas durante 1 a 2 semanas despus de la ciruga. No toque el pegamento dermabond en los sitios de incisin. Esta pelcula de pegamento permanecer en su lugar durante 1 a 2 semanas y comenzar a despegarse. No aplique lociones ni blsamos en la incisin a menos que el Dr. Henreitta LeberBridges se lo indique especficamente.  Expectativas de Engineer, miningdolor y narcticos: -Despus de la ciruga tendr dolor asociado con sus incisiones y esto es normal. El dolor es muscular y Hernandonervioso, y mejorar con Museum/gallery conservatorel tiempo. -Se le recomienda y se espera que tome medicamentos no narcticos como Tylenol e ibuprofeno (cuando pueda) para tratar Chief Technology Officerel dolor, ya que mltiples modalidades pueden ayudar con el tratamiento del Engineer, miningdolor. -Los narcticos solo se usan cuando el dolor es intenso o hay un dolor irruptivo. -No se espera que tenga una puntuacin de dolor de 0 despus de la  ciruga, ya que no podemos prevenir Chief Technology Officerel dolor. El objetivo es una puntuacin de Engineer, miningdolor de 3-4 que le permita ser funcional, [place]overse, caminar y Therapist, arttolerar alguna actividad. El Engineer, miningdolor continuar mejorando American Electric Powerdurante los das posteriores a la [place] y depende de su [place]. -Debido a la ley de [place], solo podemos dar una cierta cantidad de analgsicos para tratar Chief Technology Officerel dolor posoperatorio, y solo damos narcticos adicionales paciente por paciente. -Para la mayora de las cirugas laparoscpicas, los estudios han demostrado que la mayora de los pacientes solo necesitan de 10 a 15 pastillas narcticas, y para las [address]ugas abiertas, la mayora de los pacientes solo necesitan de 15 a 20. -Es importante tener expectativas adecuadas sobre el dolor y conocimientos sobre el manejo del dolor con medicamentos no narcticos, ya que no queremos que nadie se vuelva adicto a los Biochemist, clinicalanalgsicos narcticos. -Usar bolsas de hielo en las primeras 48 horas y almohadillas trmicas despus de 48 horas, usar una faja abdominal (cuando se recomienda) y usar medicamentos de venta libre son formas de Contractorayudar a Chief Operating Officercontrolar  el dolor. -Actos simples como la meditacin y las prcticas de atencin plena despus de la ciruga tambin pueden ayudar a Human resources officer y la investigacin ha demostrado el beneficio de Scientist, physiological.  Medicamento: Tome Tylenol e ibuprofeno segn sea necesario para controlar el dolor, alternando cada 4 a 6 horas. Ejemplo: Tylenol 1000 mg a las 6 am, 12 del medioda, 6 pm, 12 de la noche (no exceda los 4000 mg de tylenol por da). Ibuprofeno 800 mg a las 9 a. m., 3 p. m., 9 p. m., 3 a. m. (No exceda los 3600 mg de ibuprofeno por da). Tome Roxicodona para el dolor irruptivo cada 4 horas. Tome Colace para el estreimiento relacionado con analgsicos narcticos. Si no defeca en 2 das, tome Miralax sin receta. Birdie Hopes agua para prevenir tambin el estreimiento.  Informacin del contacto: Si tiene  preguntas o inquietudes, llame a Ferne [place]e oficina al [phone], de lunes a jueves de 8 a. m. a 5 p. m. y viernes de 8 a. m. a 12 p. m. Si es fuera del horario de atencin o durante el fin de [place], llame al [place], [phone]/ [phone], y pida hablar con el cirujano de guardia del Dr. Henreitta Leber en [facility].   Minimally Invasive Cholecystectomy, Care After This sheet gives you information about how to care for yourself after your procedure. Your doctor may also give you more specific instructions. If you have problems or questions, contact your doctor. What can I expect after the procedure? After the procedure, it is common:  To have pain at the areas of surgery. You will be given medicines for pain.  To vomit or feel like you may vomit.  To feel fullness in the belly (bloating) or to have pain in the shoulder. This comes from the gas that was used during the surgery. Follow these instructions at home: Medicines  Take over-the-counter and prescription medicines only as told by your doctor.  If you were prescribed an antibiotic medicine, take it as told by your doctor. Do not stop using the antibiotic even if you start to feel better.  Ask your doctor if the medicine prescribed to you: ? Requires you to avoid driving or using machinery. ? Can cause trouble pooping (constipation). You may need to take these actions to prevent or treat trouble pooping:  Drink enough fluid to keep your pee (urine) pale yellow.  Take over-the-counter or prescription medicines.  Eat foods that are high in fiber. These include beans, whole grains, and fresh fruits and vegetables.  Limit foods that are high in fat and sugar. These include fried or sweet foods. Incision care  Follow instructions from your doctor about how to take care of your cuts from surgery (incisions). Make sure you: ? Wash your hands with soap and water for at least 20 seconds before and after you change your  bandage (dressing). If you cannot use soap and water, use hand sanitizer. ? Change your bandage as told by your doctor. ? Leave stitches (sutures), skin glue, or skin tape (adhesive) strips in place. They may need to stay in place for 2 weeks or longer. If tape strips get loose and curl up, you may trim the loose edges. Do not remove tape strips completely unless your doctor says it is okay.  Do not take baths, swim, or use a hot tub until your doctor approves.   You may shower.   Check your surgery area every day for signs of infection. Check  for: ? More redness, swelling, or pain. ? Fluid or blood. ? Warmth. ? Pus or a bad smell.   Activity  Rest as told by your doctor.  Do not sit for a long time without moving. Get up to take short walks every 1-2 hours. This is important. Ask for help if you feel weak or unsteady.  Do not lift anything that is heavier than 10 lb (4.5 kg), or the limit that you are told, until your doctor says that it is safe.  Do not play contact sports until your doctor says it is okay.  Do not return to work or school until your doctor says it is okay.  Return to your normal activities as told by your doctor. Ask your doctor what activities are safe for you. General instructions  If you were given a medicine to help you relax (sedative) during your procedure, it can affect you for many hours. Do not drive or use machinery until your doctor says that it is safe.  Keep all follow-up visits as told by your doctor. This is important. Contact a doctor if:  You get a rash.  You have more redness, swelling, or pain around your cuts from surgery.  You have fluid or blood coming from your cuts from surgery.  Your cuts from surgery feel warm to the touch.  You have pus or a bad smell coming from your cuts from surgery.  You have a fever.  One or more of your cuts from surgery breaks open. Get help right away if:  You have trouble breathing.  You have  chest pain.  You have pain that is getting worse in your shoulders.  You faint or feel dizzy when you stand.  You have very bad pain in your belly (abdomen).  You feel like you may vomit or you vomit, and this lasts for more than one day.  You have leg pain. Summary  After your surgery, it is common to have pain at the areas of surgery. You may also have vomiting or fullness in the belly.  Follow your doctor's instructions about medicine, activity restrictions, and caring for your surgery areas. Do not do activities that require a lot of effort.  Contact a doctor if you have a fever or other signs of infection, such as more redness, swelling, or pain around the cuts from surgery.  Get help right away if you have chest pain, increasing pain in the shoulders, or trouble breathing. This information is not intended to replace advice given to you by your health care provider. Make sure you discuss any questions you have with your health care provider. Document Revised: 10/09/2019 Document Reviewed: 10/09/2019 Elsevier Patient Education  2021 Elsevier Inc.    Colecistectoma mnimamente invasiva, cuidados posteriores Minimally Invasive Cholecystectomy, Care After Esta hoja le brinda informacin sobre cmo cuidarse despus del procedimiento. El mdico tambin podr darle instrucciones ms especficas. Si tiene problemas o preguntas, llame al mdico. Qu puedo esperar despus del procedimiento? Despus del procedimiento, es comn lo siguiente:  Ecologist las zonas de la [place]. Le darn medicamentos para Chief Technology Officer.  Vomitar o tener nuseas.  Sentirse el vientre lleno (meteorismo) o Surveyor, mining en el hombro. Esto se debe al gas que se [place] durante la [place]. Siga estas instrucciones en su casa: Medicamentos  Use los medicamentos de venta libre y los recetados solamente como se lo haya indicado el mdico.  Si le recetaron un antibitico, tmelo como se lo haya indicado  el  mdico. No deje de usar el antibitico, aunque comience a Actor.  Consulte a su mdico si el medicamento que le recetaron: ? Hace necesario que evite conducir o usar maquinaria. ? Puede causarle dificultad para defecar (estreimiento). Es posible que deba tomar medidas para prevenir o tratar los problemas para defecar:  Product manager suficiente lquido para Radio producer pis (orina) de color amarillo plido.  Usar medicamentos recetados o de Sales promotion account executive.  Comer alimentos ricos en fibra. Entre ellos, frijoles, cereales integrales y frutas y verduras frescas.  Limitar los alimentos con alto contenido de grasa y International aid/development worker. Estos incluyen alimentos fritos o dulces. Cuidado de la incisin  Siga las instrucciones del mdico en lo que respecta al cuidado de los cortes de la ciruga (incisiones). Asegrese de hacer lo siguiente: ? Lvese las manos con agua y jabn durante al menos 20segundos antes y despus de cambiarse la venda (vendaje). Use un desinfectante para manos si no dispone de [place] y [place]. ? Cambie la venda como se lo haya indicado el mdico. ? No retire los puntos (suturas), la goma para cerrar la piel o las tiras de cinta para la piel [place]). Tal vez deban dejarse puestos en la piel durante 2semanas o ms. Si las tiras [place]obb[place] se despegan y se enroscan, puede recortar los bordes sueltos. No retire las tiras [employer] por completo a menos que el mdico lo autorice.  No se d baos de inmersin, no nade ni use el jacuzzi hasta que el mdico lo apruebe.   Puedes duchar.  Controle la zona de la ciruga todos los [address] para detectar signos de infeccin. Est atento a los siguientes signos: ? Aumento del enrojecimiento, la hinchazn o Chief Technology Officer. ? Lquido o sangre. ? Calor. ? Pus o mal olor.   Actividad  Haga reposo como se lo haya indicado el mdico.  No permanezca sentado durante un perodo prolongado sin moverse. Levntese y camine un poco cada 1 a 2 horas. Esto es importante.  Pida ayuda si se siente dbil o inestable.  No levante ningn objeto que pese ms de 10libras (4.5kg) o el lmite de peso que le indiquen hasta que el mdico le diga que puede [place].  No practique deportes de contacto hasta que el mdico lo autorice.  No retome el trabajo ni el estudio hasta que el mdico lo autorice.  Retome sus actividades normales segn lo indicado por el mdico. Pregntele al mdico qu actividades son seguras para usted. Instrucciones generales  Si le administraron un medicamento para ayudarlo a relajarse (sedante) durante el procedimiento, puede afectarlo durante muchas horas. No conduzca ni use maquinaria hasta que el [employer] diga que puede [place].  Concurra a todas las visitas de [address] se lo haya indicado el mdico. Esto es importante. Comunquese con un mdico si:  Aparece una erupcin cutnea.  Aumenta el enrojecimiento, la hinchazn o Chief Technology Officer alrededor de los cortes de la [place].  Le sale lquido o sangre de los cortes de la [place].  Los cortes de la ciruga se sienten calientes al tacto.  Tiene pus o percibe mal [employer] cortes de la [place].  Tiene fiebre.  Se abre uno o ms de los cortes de la [place]. Solicite ayuda de inmediato si:  Tiene dificultad para respirar.  Siente dolor en el pecho.  Siente dolor que empeora en la zona de los hombros.  Se desmaya o se siente mareado al ponerse de pie.  Tiene dolor muy intenso en  el vientre (abdomen).  Siente que va a vomitar o vomita y esto dura ms de Civil engineer, contracting.  Siente dolor en la pierna. Resumen  Despus de la [place], es comn Financial risk analyst en las zonas de la [place]. Tambin puede tener vmitos o distensin en el vientre.  Siga las instrucciones del mdico acerca de los medicamentos, la restriccin de actividades y el cuidado de las zonas de la [place]. No realice actividades que requieran mucho esfuerzo.  Comunquese con el mdico si tiene fiebre u otros  signos de infeccin, como ms enrojecimiento, hinchazn o dolor alrededor [employer] cortes de la [place].  Busque ayuda de inmediato si tiene dolor de pecho, Engineer, mining en los hombros que va en aumento o problemas para Industrial/product designer. Esta informacin no tiene Theme park manager el consejo del mdico. Asegrese de hacerle al mdico cualquier pregunta que tenga. Document Revised: 12/03/2019 Document Reviewed: 12/03/2019 Elsevier Patient Education  2021 Elsevier Inc.      Anestesia general en adultos, cuidados posteriores General Anesthesia, Adult, Care After Esta hoja le brinda informacin sobre cmo cuidarse despus del procedimiento. El mdico tambin podr darle instrucciones ms especficas. Comunquese con el mdico si tiene problemas o preguntas. Qu puedo esperar despus del procedimiento? Luego del procedimiento, son comunes los siguiente efectos secundarios:  Dolor o Associate Professor en el lugar de la va intravenosa (i.v.).  Nuseas.  Vmitos.  El dolor de Advertising copywriter.  Dificultad para concentrarte.  Sentir fro o [employer].  Sensacin de debilidad o cansancio.  Somnolencia y Management consultant.  Malestar y Tourist information centre manager. Estos efectos secundarios pueden afectar partes del cuerpo que no estuvieron involucradas en la ciruga. Siga estas instrucciones en su casa: Durante el perodo de [employer] le haya indicado el mdico:  Descanse.  No participe en actividades que impliquen posibles cadas o lesiones.  No conduzca ni opere maquinaria.  No beba alcohol.  No tome somnferos ni medicamentos que causen somnolencia.  No tome decisiones trascendentes ni firme documentos importantes.  No cuide a nios por su cuenta.   Comida y bebida  Siga las indicaciones del mdico respecto de las restricciones de comidas o bebidas.  Cuando [employer], comience a comer cantidades pequeas de alimentos que sean blandos y fciles de Location manager (livianos), como una tostada. Retome su dieta habitual de  forma gradual.  Beber suficiente lquido como para mantener la orina de color amarillo plido.  Si vomita, rehidrtese tomando agua, jugo o caldo transparente. Instrucciones generales  Si tiene apnea del sueo, la [place] y ciertos medicamentos pueden elevar su riesgo de tener problemas respiratorios. Siga las instrucciones del mdico respecto al uso del dispositivo para dormir: ? Siempre que duerma, incluso durante las siestas que tome en el da. ? Mientras tome analgsicos recetados, medicamentos para dormir o medicamentos que producen somnolencia.  Pida a un adulto responsable que se quede con usted durante el tiempo que se le indique. Es importante tener a alguien que lo ayude a cuidarse hasta que est despierto y Research officer, political party.  Retome sus actividades normales segn lo indicado por el mdico. Pregntele al mdico qu actividades son seguras para usted.  Use los medicamentos de venta libre y los recetados solamente como se lo haya indicado el mdico.  Si fuma, no lo haga sin supervisin.  Concurra a todas las visitas de [address] se lo haya indicado el mdico. Esto es importante. Comunquese con un mdico si:  Tiene nuseas o vmitos que no mejoran con medicamentos.  No puede comer ni beber sin vomitar.  Su dolor no se alivia con medicamentos.  No puede orinar.  Tiene una erupcin cutnea.  Tiene fiebre.  Presenta enrojecimiento alrededor del lugar de la va intravenosa (i.v.) que empeora. Solicite ayuda de inmediato si:  Tienes dificultad para respirar.  Sientes dolor en el pecho.  Observa sangre en la orina o heces, o vomita sangre. Resumen  Despus del procedimiento, es comn tener dolor de garganta y nuseas. Tambin es comn sentirse cansado.  Pida a un adulto responsable que se quede con usted durante el tiempo que se le indique. Es importante tener a alguien que lo ayude a cuidarse hasta que est despierto y Research officer, political party.  Cuando [employer], comience a  comer cantidades pequeas de alimentos que sean blandos y fciles de Location manager (livianos), como una tostada. Retome su dieta habitual de forma gradual.  Beber suficiente lquido como para mantener la orina de color amarillo plido.  Retome sus actividades normales segn lo indicado por el mdico. Pregntele al mdico qu actividades son seguras para usted. Esta informacin no tiene Theme park manager el consejo del mdico. Asegrese de hacerle al mdico cualquier pregunta que tenga. Document Revised: 04/17/2020 Document Reviewed: 04/17/2020 Elsevier Patient Education  2021 Elsevier Inc.    Oxycodone Capsules or Tablets Ladell Heads es [place] medicamento? La OXICODONA es un analgsico. Se [place] para tratar el dolor moderado a severo. Este medicamento puede ser utilizado para otros usos; si tiene alguna pregunta consulte con su proveedor de atencin mdica o con su farmacutico. MARCAS COMUNES: Dazidox, Endocodone, Oxaydo, [place], OxyIR, Percolone, Roxicodone, Roxybond [employer] debo informar a mi profesional de la salud antes de tomar este medicamento? Necesitan saber si usted presenta alguno de los siguientes problemas o situaciones: enfermedad de [place] tumor cerebral lesin de la cabeza enfermedad cardiaca antecedentes de abuso de alcohol o drogas si bebe alcohol con frecuencia enfermedad renal enfermedad heptica enfermedad pulmonar o respiratoria, como asma trastorno mental enfermedad pancretica convulsiones enfermedad tiroidea una reaccin inusual o alrgica a la oxicodona, codena, hidrocodona, morfina, a otros medicamentos, alimentos, colorantes o conservantes si est embarazada o buscando quedar embarazada si est amamantando a un beb Cmo debo utilizar este medicamento? Tome este medicamento por va oral con un vaso de agua. Siga las instrucciones de la etiqueta del [place]. Puede tomarlo con o sin alimentos. Si el Social worker, tmelo con alimentos. Tome su  medicamento a intervalos regulares. No lo tome con una frecuencia mayor a la indicada. No deje de tomarlo, excepto si as lo indica su mdico. Algunas marcas de [place], como [place], tienen instrucciones especiales. Consulte a su mdico o farmacutico si estas instrucciones son para usted: no corte, triture ni Magazine features editor. Trague solo una tableta a Licensed conveyancer. No humedezca, moje, ni chupe la tableta antes de tomarla. Su farmacutico le dar una [place] del medicamento especial (MedGuide, nombre en ingls) con cada receta y en cada ocasin que la vuelva a surtir. Asegrese de leer esta informacin cada vez cuidadosamente. Hable con su pediatra para informarse acerca del uso de este medicamento en nios. Puede requerir atencin especial. Sobredosis: Pngase en contacto inmediatamente con un centro toxicolgico o una sala de urgencia si usted cree que haya tomado demasiado medicamento. ATENCIN: [employer] es solo para usted. No comparta este medicamento con nadie. Qu sucede si me olvido de una dosis? Si olvida una dosis, tmela lo antes posible. Si es casi la hora de la prxima dosis, tome slo esa dosis. No tome dosis adicionales o dobles. Daiva Huge  interactuar con este medicamento? Esta medicina puede interactuar con los siguientes medicamentos: alcohol antihistamnicos para Programmer, multimedia, tos y resfro medicamentos antivirales para el VIH o SIDA atropina ciertos antibiticos, tales Copywriter, advertising, eritromicina, linezolida, rifampicina ciertos medicamentos para la ansiedad o para conciliar el sueo ciertos medicamentos para problemas de vejiga, tales como oxibutinina, tolterodina ciertos medicamentos para la depresin, como amitriptilina, fluoxetina, sertralina ciertos medicamentos para infecciones micticas, tales como quetoconazol, itraconazol, voriconazol ciertos medicamentos para la migraa, tales como almotriptn, eletriptn, frovatriptn, naratriptn, rizatriptn, sumatriptn,  zolmitriptn ciertos medicamentos para las nuseas o los vmitos, tales como dolasetrn, [place], Paediatric nurse ciertos medicamentos para el mal de Parkinson, tales como benzatropina, trihexifenidilo ciertos medicamentos para convulsiones, tales como fenobarbital, fenitona, primidona ciertos medicamentos para problemas estomacales, tales como diciclomina, hiosciamina ciertos medicamentos para el mareo por movimiento, tal como escopolamina diurticos anestsicos generales, tales como halotano, isoflurano, metoxiflurano, propofol ipratropio anestsicos locales, tales como lidocana, pramoxina, tetracana IMAO, tales como Carbex, Eldepryl, Marplan, Nardil y Parnate medicamentos para relajar los msculos antes de una ciruga azul de metileno nilotinib otros medicamentos narcticos para Chief Technology Officer o la tos fenotiazinas, tales como Barista, Manufacturing engineer, Paediatric nurse, tioridazina Puede ser que esta lista no menciona todas las posibles interacciones. Informe a su profesional de [employer] de [employer] Inc productos a base de hierbas, medicamentos de [place] o suplementos nutritivos que est tomando. Si usted fuma, consume bebidas alcohlicas o si utiliza drogas ilegales, indqueselo tambin a su profesional de [employer]. Algunas sustancias pueden interactuar con su medicamento. A qu debo estar atento al usar PPL Corporation? Informe a su proveedor de atencin mdica si el dolor no desaparece, si empeora, o si tiene un dolor nuevo o diferente. Es posible que desarrolle tolerancia a este frmaco. Tolerancia significa que necesitar una dosis ms alta del frmaco para Engineer, materials. La tolerancia es normal y previsible si [place] este frmaco por [employer]. Existen distintos tipos de frmacos narcticos (opioides) para Chief Technology Officer. Si [place] ms de un tipo a la vez, es posible que tenga ms efectos secundarios. Entrguele a su proveedor de atencin mdica una lista de todos los frmacos que [place]. l o ella le dir  qu cantidad debe usar del frmaco. No use ms frmaco de lo indicado. Busque ayuda de emergencia de inmediato si tiene problemas para respirar. No deje de usar su frmaco repentinamente porque puede desarrollar una reaccin grave. Su cuerpo se acostumbra al frmaco. Esto NO significa que usted es [place]. La adiccin es una conducta relacionada a la obtencin y [place] de una droga por razones que no son mdicas. Si usted tiene Engineer, mining, tiene una razn mdica para usar el frmaco. Su proveedor de atencin mdica le dir cunto Glass blower/designer. Si su proveedor de atencin mdica desea que deje de Actuary, la dosis se ir disminuyendo lentamente durante un tiempo para Printmaker secundario. Hable con su proveedor de atencin [employer] naloxona y cmo [place]. La naloxona es un frmaco de emergencia usado para tratar una sobredosis de opioides. Puede producirse una sobredosis si usted [place] demasiada cantidad de un opioide. Tambin puede suceder si se [place] un opioide con algunos otros frmacos o sustancias, tales como alcohol. Conozca los sntomas de una sobredosis, tales como dificultad para respirar, cansancio o somnolencia inusual, o no poder responder o despertarse. Asegrese de decirles a los cuidadores y a los contactos cercanos dnde est guardada. Asegrese de que sepan cmo usarla. Despus de que se administra la naloxona, debe recibir [place] de Associate Professor  de inmediato. La naloxona es un tratamiento temporal. Es posible que se necesiten varias dosis. Puede experimentar somnolencia o mareos. No conduzca, no utilice maquinaria ni haga nada que Scientist, research (life sciences) en estado de alerta hasta que sepa cmo le afecta este frmaco. No se siente ni se ponga de pie con rapidez, especialmente si es un paciente de edad avanzada. Esto reduce el riesgo de mareos o [employer]. El alcohol puede interferir con el efecto de este frmaco. Evite consumir bebidas alcohlicas. Este frmaco causar  estreimiento. Si no evacua los intestinos [place], llame a su proveedor de Psychologist, prison and probation services. Se le podra secar la boca. Masticar chicle sin azcar o chupar caramelos duros y beber agua en abundancia le ayudar a mantener la boca hmeda. Si el problema no desaparece o es severo, consulte a su proveedor de Psychologist, prison and probation services. El recubrimiento de la tableta de algunas marcas de este frmaco no se disuelve. Esto es normal. Es posible que encuentre el recubrimiento intacto de las tabletas en las heces. Esto no debe ser motivo de preocupacin. Qu efectos secundarios puedo tener al [employer] este medicamento? Efectos secundarios que debe informar a su mdico o a Producer, television/film/video de la salud tan pronto como sea posible: Therapist, art, como erupcin cutnea, comezn/picazn o urticarias, e hinchazn de la cara, los labios o la lengua problemas respiratorios confusin signos y sntomas de presin sangunea baja, tales como [place], sensacin de [place] o aturdimiento, cadas, cansancio o debilidad inusual dificultad para orinar o cambios en el volumen de orina problemas para tragar Efectos secundarios que generalmente no requieren atencin mdica (infrmelos a su mdico o a su profesional de la salud si persisten o si son molestos): estreimiento boca seca nuseas, vmito cansancio Puede ser que esta lista no menciona todos los posibles efectos secundarios. Comunquese a su mdico por asesoramiento mdico Hewlett[employer]ard. Usted puede informar los efectos secundarios a la FDA por telfono al 1-800-FDA-1088. Dnde debo guardar mi medicina? Mantenga fuera del alcance de los nios. Existe la posibilidad de abusar de PPL Corporation. Mantenga su medicamento en un lugar seguro para protegerlo contra robos. No comparta este medicamento con nadie. Es peligroso vender o Restaurant manager, fast food, y est prohibido por la ley. Guarde a [employer], entre 15 y 30 grados Celsius (59 y 36  grados Fahrenheit). Proteja de Statistician. Mantenga el recipiente bien cerrado. Este medicamento puede causar daos y la muerte si lo toman otros adultos, nios o Neurosurgeon. Regrese el medicamento que no haya utilizado a Administrator oficial para desecharlo. Contacte a la DEA al [phone] o al gobierno de su ciudad/condado para [place]. Si no puede devolver el medicamento, arrjelo por el sanitario. No utilice este medicamento despus de la fecha de vencimiento. ATENCIN: Este folleto es un resumen. Puede ser que no cubra toda la posible informacin. Si usted tiene preguntas acerca de esta medicina, consulte con su mdico, su farmacutico o su profesional de Radiographer, therapeutic.  2021 Elsevier/Gold Standard (2019-08-29 00:00:00)    Ondansetron oral dissolving tablet Qu es este medicamento? El ONDANSETRN se [place] para tratar las nuseas y el vmito causados por la quimioterapia. Tambin se [place] para prevenir o tratar las nuseas y el vmito despus de una operacin. Este medicamento puede ser utilizado para otros usos; si tiene alguna pregunta consulte con su proveedor de atencin mdica o con su farmacutico. MARCAS COMUNES: Zofran ODT Qu le debo informar a mi profesional de la salud antes de tomar este medicamento? Necesita saber si  usted presenta alguno de los siguientes problemas o situaciones:  enfermedad cardiaca  antecedentes de pulso cardiaco irregular  enfermedad heptica  niveles bajos de magnesio o potasio en la sangre  una reaccin alrgica o inusual al ondansetrn, granisetrn, a otros medicamentos, alimentos, colorantes o conservantes  si est embarazada o buscando quedar embarazada  si est amamantando a un beb Cmo debo SLM Corporationutilizar este medicamento? Estas tabletas estn hechas para disolverse en la boca. No empuje la tableta a travs del envase de aluminio. Con las manos secas remueva la tableta con cuidado de su envase. Coloque la tableta en la boca y Statisticiandeje disolver.  Cuando se disuelva, ingirala. Estas tabletas se pueden tomar con agua, pero no es necesario que lo haga. Hable con su pediatra para informarse acerca del uso de este medicamento en nios. Puede requerir atencin especial. Sobredosis: Pngase en contacto inmediatamente con un centro toxicolgico o una sala de urgencia si usted cree que haya tomado demasiado medicamento. ATENCIN: Reynolds AmericanEste medicamento es solo para usted. No comparta este medicamento con nadie. Qu sucede si me olvido de una dosis? Si olvida una dosis, tmela lo antes posible. Si es casi la hora de la prxima dosis, tome slo esa dosis. No tome dosis adicionales o dobles. Qu puede interactuar con este medicamento? No use este medicamento con ninguno de los siguientes frmacos: apomorfina ciertos medicamentos para infecciones micticas, tales como fluconazol, itraconazol, ketoconazol, posaconazol y voriconazol cisaprida dronedarona pimozida tioridazina Este medicamento tambin puede interactuar con los siguientes frmacos: carbamazepina ciertos medicamentos para la depresin, ansiedad o trastornos psicticos fentanilo linezolida IMAO, tales como Stewardarbex, ShannonEldepryl, [place]inaMarplan, Nardil y Parnate azul de metileno (inyectado en una vena) otros medicamentos que prolongan el intervalo QT (causan un ritmo cardiaco anormal) tales como dofetilida, ziprasidona fenitona rifampicina tramadol Puede ser que esta lista no menciona todas las posibles interacciones. Informe a su profesional de Beazer Homesla salud de Ingram Micro Inctodos los productos a base de hierbas, medicamentos de [place]a libre o suplementos nutritivos que est tomando. Si usted fuma, consume bebidas alcohlicas o si utiliza drogas ilegales, indqueselo tambin a su profesional de Beazer Homesla salud. Algunas sustancias pueden interactuar con su medicamento. A qu debo estar atento al usar PPL Corporationeste medicamento? Si experimenta algn signo de reaccin alrgica, consulte a su mdico o a su profesional de Beazer Homesla salud lo antes  posible. Qu efectos secundarios puedo tener al Boston Scientificutilizar este medicamento? Efectos secundarios que debe informar a su mdico o a Producer, television/film/videosu profesional de la salud tan pronto como sea posible:  Therapist, artreacciones alrgicas como erupcin cutnea, picazn o urticarias, hinchazn de la cara, labios o lengua  problemas respiratorios  confusin  mareos  pulso cardiaco rpido o irregular  sensacin de desmayos o aturdimiento, cadas  fiebre y escalofros  prdida del equilibrio o coordinacin  convulsiones  sudoracin  hinchazn de las manos o pies  opresin en el pecho  temblores  cansancio o debilidad inusual Efectos secundarios que, por lo general, no requieren atencin mdica (debe informarlos a su mdico o a Producer, television/film/videosu profesional de la salud si persisten o si son molestos):  estreimiento o diarrea  dolor de cabeza Puede ser que esta lista no menciona todos los posibles efectos secundarios. Comunquese a su mdico por asesoramiento mdico Hewlett-Packardsobre los efectos secundarios. Usted puede informar los efectos secundarios a la FDA por telfono al 1-800-FDA-1088. Dnde debo guardar mi medicina? Mantngala fuera del alcance de los nios. Gurdela a una temperatura de [place]entre 2 y 30 grados C (36 y 6286 grados F). Deseche todo el medicamento que  no haya utilizado, despus de la fecha de vencimiento. ATENCIN: Este folleto es un resumen. Puede ser que no cubra toda la posible informacin. Si usted tiene preguntas acerca de esta medicina, consulte con su mdico, su farmacutico o su profesional de Radiographer, therapeutic.  2021 Elsevier/Gold Standard (2019-03-26 00:00:00)

## 2020-11-27 ENCOUNTER — Encounter (HOSPITAL_COMMUNITY): Payer: Self-pay | Admitting: General Surgery

## 2020-11-27 LAB — SURGICAL PATHOLOGY

## 2020-12-04 ENCOUNTER — Other Ambulatory Visit: Payer: Self-pay

## 2020-12-04 ENCOUNTER — Ambulatory Visit (INDEPENDENT_AMBULATORY_CARE_PROVIDER_SITE_OTHER): Payer: 59 | Admitting: Internal Medicine

## 2020-12-04 ENCOUNTER — Encounter (INDEPENDENT_AMBULATORY_CARE_PROVIDER_SITE_OTHER): Payer: Self-pay | Admitting: Internal Medicine

## 2020-12-04 VITALS — BP 128/67 | HR 89 | Temp 97.1°F | Resp 19 | Ht <= 58 in | Wt 138.4 lb

## 2020-12-04 DIAGNOSIS — E119 Type 2 diabetes mellitus without complications: Secondary | ICD-10-CM

## 2020-12-04 DIAGNOSIS — E785 Hyperlipidemia, unspecified: Secondary | ICD-10-CM

## 2020-12-04 NOTE — Progress Notes (Signed)
Metrics: Intervention Frequency ACO  Documented Smoking Status Yearly  Screened one or more times in 24 months  Cessation Counseling or  Active cessation medication Past 24 months  Past 24 months   Guideline developer: UpToDate (See UpToDate for funding source) Date Released: 2014       Wellness Office Visit  Subjective:  Patient ID: Kayla Marks, female    DOB: 1961/04/28  Age: 60 y.o. MRN: [id number]  CC: This is a 60 year old [place] lady originally who has been living in this country for over 30 years who presents as a new patient to establish care. She has type 2 diabetes. HPI  She recently had a cholecystectomy and apparently still is somewhat jaundiced according to her but much less so. She takes Metformin for diabetes.  At one time she was taking insulin but was asking about this. Her previous physician did apparently blood work 2 to 3 months ago but she cannot remember what her hemoglobin A1c was and I am not sure she knows what this blood test means. We have no previous records from the previous primary care physician. Past Medical History:  Diagnosis Date  . Bell's palsy    history, approx 2011  . Diabetes mellitus    metformin  . Obesity   . Palpitations   . Retinopathy   . Right eye symptoms    "nerve issue"  . Weakness    Past Surgical History:  Procedure Laterality Date  . ABDOMINAL HYSTERECTOMY  1996  . CESAREAN SECTION  1990  . CHOLECYSTECTOMY N/A 11/26/2020   Procedure: LAPAROSCOPIC CHOLECYSTECTOMY;  Surgeon: Virl Cagey, MD;  Location: [facility];  Service: General;  Laterality: N/A;     Family History  Problem Relation Age of Onset  . Other Father        complications of CVA  . Diabetes Brother   . 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   No regular exercise   Children 3   Education- 12    Originally from [place]- in [place] years.   Social History    Tobacco Use  . Smoking status: Never Smoker  . Smokeless tobacco: Never Used  Substance Use Topics  . Alcohol use: No    Current Meds  Medication Sig  . metFORMIN (GLUCOPHAGE) 1000 MG tablet Take 1,000 mg by mouth in the morning and at bedtime.  . [DISCONTINUED] LANTUS SOLOSTAR 100 UNIT/ML Solostar Pen SMARTSIG:36 Unit(s) SUB-Q Twice Daily      Depression screen [facility] 2/9 12/04/2020 12/04/2020 08/02/2017  Decreased Interest 0 0 0  Down, Depressed, Hopeless 0 0 0  PHQ - 2 Score 0 0 0     Objective:   Today's Vitals: BP 128/67 (BP Location: Left Arm, Patient Position: Sitting, Cuff Size: Normal)   Pulse 89   Temp (!) 97.1 F (36.2 C) (Temporal)   Resp 19   Ht '4\' 7"'  (1.397 m)   Wt 138 lb 6.4 oz (62.8 kg)   SpO2 98%   BMI 32.17 kg/m  Vitals with BMI 12/04/2020 11/26/2020 11/26/2020  Height '4\' 7"'  - -  Weight 138 lbs 6 oz - -  BMI 70.01 - -  Systolic [phone]  Diastolic 67 72 72  Pulse 89 88 91     Physical Exam  She is obese.  Blood pressure is acceptable.  She looks possibly mildly  jaundiced.  She is alert and orientated without any focal neurological signs     Assessment   1. Diabetes mellitus without complication ([place])   2. Dyslipidemia       Tests ordered Orders Placed This Encounter  Procedures  . COMPLETE METABOLIC PANEL WITH GFR  . Hemoglobin A1c  . Lipid panel  . T3, free  . T4, free  . TSH     Plan: 1. She will continue with Metformin for the time being. 2. Further recommendations will depend on blood results and I will see her in the next 2 to 3 weeks time to discuss all these results in more detail, focusing on nutrition initially.   No orders of the defined types were placed in this encounter.   Doree Albee, MD

## 2020-12-05 LAB — LIPID PANEL
Cholesterol: 122 mg/dL (ref <200)
HDL: 29 mg/dL — ABNORMAL LOW (ref >=50)
LDL Cholesterol (Calc): 72 mg/dL (calc)
Non-HDL Cholesterol (Calc): 93 mg/dL (calc) (ref <130)
Total CHOL/HDL Ratio: 4.2 (calc) (ref <5.0)
Triglycerides: 119 mg/dL (ref <150)

## 2020-12-05 LAB — HEMOGLOBIN A1C
Hgb A1c MFr Bld: 12.3 % of total Hgb — ABNORMAL HIGH (ref <5.7)
Mean Plasma Glucose: 306 mg/dL
eAG (mmol/L): 17 mmol/L

## 2020-12-05 LAB — T3, FREE: T3, Free: 2.9 pg/mL (ref 2.3–4.2)

## 2020-12-05 LAB — COMPLETE METABOLIC PANEL WITH GFR
AG Ratio: 1.3 (calc) (ref 1.0–2.5)
ALT: 22 U/L (ref 6–29)
AST: 19 U/L (ref 10–35)
Albumin: 3.7 g/dL (ref 3.6–5.1)
Alkaline phosphatase (APISO): 181 U/L — ABNORMAL HIGH (ref 37–153)
BUN/Creatinine Ratio: 18 (calc) (ref 6–22)
BUN: 9 mg/dL (ref 7–25)
CO2: 24 mmol/L (ref 20–32)
Calcium: 9 mg/dL (ref 8.6–10.4)
Chloride: 100 mmol/L (ref 98–110)
Creat: 0.49 mg/dL — ABNORMAL LOW (ref 0.50–1.05)
GFR, Est African American: 124 mL/min/{1.73_m2} (ref >=60)
GFR, Est Non African American: 107 mL/min/{1.73_m2} (ref >=60)
Globulin: 2.8 g/dL (calc) (ref 1.9–3.7)
Glucose, Bld: 280 mg/dL — ABNORMAL HIGH (ref 65–139)
Potassium: 4.4 mmol/L (ref 3.5–5.3)
Sodium: 135 mmol/L (ref 135–146)
Total Bilirubin: 0.5 mg/dL (ref 0.2–1.2)
Total Protein: 6.5 g/dL (ref 6.1–8.1)

## 2020-12-05 LAB — TSH: TSH: 0.82 mIU/L (ref 0.40–4.50)

## 2020-12-05 LAB — T4, FREE: Free T4: 1.3 ng/dL (ref 0.8–1.8)

## 2020-12-09 ENCOUNTER — Other Ambulatory Visit: Payer: Self-pay

## 2020-12-09 ENCOUNTER — Other Ambulatory Visit: Payer: Self-pay | Admitting: Family Medicine

## 2020-12-09 ENCOUNTER — Encounter: Payer: Self-pay | Admitting: General Surgery

## 2020-12-09 ENCOUNTER — Ambulatory Visit (INDEPENDENT_AMBULATORY_CARE_PROVIDER_SITE_OTHER): Payer: Self-pay | Admitting: General Surgery

## 2020-12-09 VITALS — BP 111/73 | HR 87 | Temp 97.6°F | Resp 16 | Ht <= 58 in | Wt 130.0 lb

## 2020-12-09 DIAGNOSIS — K8020 Calculus of gallbladder without cholecystitis without obstruction: Secondary | ICD-10-CM

## 2020-12-09 MED ORDER — ONDANSETRON HCL 4 MG PO TABS: 4.0000 mg | ORAL_TABLET | Freq: Three times a day (TID) | ORAL | 0 refills | Status: DC | PRN

## 2020-12-09 MED ORDER — TRAMADOL HCL 50 MG PO TABS: 50.0000 mg | ORAL_TABLET | Freq: Four times a day (QID) | ORAL | 0 refills | Status: DC | PRN

## 2020-12-09 NOTE — Patient Instructions (Addendum)
Soft foods and liquids. Will refer to gastroenterology for possible endoscopy.  Will see back next week. Tramadol for pain. Zofran for nausea.    Alimentos blandos y lquidos. Remitir a gastroenterologa para posible endoscopia. Volver a Oncologist. Tramadol para el dolor. Zofran para las nuseas.

## 2020-12-09 NOTE — Progress Notes (Signed)
[facility]   HPI:  60 y.o. Female presents to clinic for post-op follow-up evaluation after a laparoscopic cholecystectomy. She had chronic cholecystitis on her pathology.  She is here today with her son and an interpretor. She reports feeling like food is getting stuck in her upper esophagus and some nausea and vomiting. She had labs with Dr. Karilyn Cota that were all reassuring last week.  She says she has some right sided abdominal pain too.  Review of Systems:  No fever or chills Regular Bm Some nausea and vomiting at night Food stuck in her esophagus  All other review of systems: otherwise negative   Vital Signs:  BP 111/73   Pulse 87   Temp 97.6 F (36.4 C) (Other (Comment))   Resp 16   Ht 4\' 7"  (1.397 m)   Wt 130 lb (59 kg)   SpO2 98%   BMI 30.21 kg/m    Physical Exam:  Physical Exam Vitals reviewed.  Cardiovascular:     Rate and Rhythm: Normal rate.  Pulmonary:     Effort: Pulmonary effort is normal.  Abdominal:     General: There is no distension.     Palpations: Abdomen is soft.     Tenderness: There is abdominal tenderness.     Comments: Port sites healing with dermabond, no erythema or drainage, right sided abdominal pain minimally with palpation   Neurological:     Mental Status: She is alert.     Results for ELIANYS, CONRY (MRN Marcellina Millin) as of 12/09/2020 13:10  Ref. Range 12/04/2020 17:38  Sodium Latest Ref Range: 135 - 146 mmol/L 135  Potassium Latest Ref Range: 3.5 - 5.3 mmol/L 4.4  Chloride Latest Ref Range: 98 - 110 mmol/L 100  CO2 Latest Ref Range: 20 - 32 mmol/L 24  Glucose Latest Ref Range: 65 - 139 mg/dL 12/06/2020 (H)  Mean Plasma Glucose Latest Units: mg/dL 329  BUN Latest Ref Range: 7 - 25 mg/dL 9  Creatinine Latest Ref Range: 0.50 - 1.05 mg/dL 518 (L)  Calcium Latest Ref Range: 8.6 - 10.4 mg/dL 9.0  BUN/Creatinine Ratio Latest Ref Range: 6 - 22 (calc) 18  AG Ratio Latest Ref Range: 1.0 - 2.5 (calc) 1.3  AST Latest Ref  Range: 10 - 35 U/L 19  ALT Latest Ref Range: 6 - 29 U/L 22  Total Protein Latest Ref Range: 6.1 - 8.1 g/dL 6.5  Total Bilirubin Latest Ref Range: 0.2 - 1.2 mg/dL 0.5  GFR, Est Non African American Latest Ref Range: > OR = 60 mL/min/1.75m2 107  GFR, Est African American Latest Ref Range: > OR = 60 mL/min/1.31m2 124  Total CHOL/HDL Ratio Latest Ref Range: <5.0 (calc) 4.2  Cholesterol Latest Ref Range: <200 mg/dL 75m  HDL Cholesterol Latest Ref Range: > OR = 50 mg/dL 29 (L)  LDL Cholesterol (Calc) Latest Units: mg/dL (calc) 72  Non-HDL Cholesterol (Calc) Latest Ref Range: <130 mg/dL (calc) 93  Triglycerides Latest Ref Range: <150 mg/dL 660  Alkaline phosphatase (APISO) Latest Ref Range: 37 - 153 U/L 181 (H)  Globulin Latest Ref Range: 1.9 - 3.7 g/dL (calc) 2.8  eAG (mmol/L) Latest Units: mmol/L 17.0  Hemoglobin A1C Latest Ref Range: <5.7 % of total Hgb 12.3 (H)  TSH Latest Ref Range: 0.40 - 4.50 mIU/L 0.82  Triiodothyronine,Free,Serum Latest Ref Range: 2.3 - 4.2 pg/mL 2.9  T4,Free(Direct) Latest Ref Range: 0.8 - 1.8 ng/dL 1.3  Albumin MSPROF Latest Ref Range: 3.6 - 5.1 g/dL 3.7  Assessment:  60 y.o. yo Female with some dysphagia and lower abdominal pain. I think she could have muscle pain from the surgery still. No signs of infection. She also having dysphagia that could be related to intubation versus esophagus issues primarily. she denies any reflux symptoms.   Plan:  Soft foods and liquids. Will refer to gastroenterology for possible endoscopy.  Will see back next week. Tramadol for pain. Zofran for nausea.  If no improvement next week may need to do CT Go to ED if worsening issues.   Future Appointments  Date Time Provider Department Center  12/16/2020 10:45 AM Lucretia Roers, MD [facility] None  12/23/2020  2:45 PM Wilson Singer, MD [facility] None    Algis Greenhouse, MD [facility] [address] [place], [place] [address] [phone]  (office)

## 2020-12-11 ENCOUNTER — Ambulatory Visit: Payer: 59 | Admitting: General Surgery

## 2020-12-16 ENCOUNTER — Ambulatory Visit (INDEPENDENT_AMBULATORY_CARE_PROVIDER_SITE_OTHER): Payer: 59 | Admitting: General Surgery

## 2020-12-16 ENCOUNTER — Other Ambulatory Visit: Payer: Self-pay

## 2020-12-16 ENCOUNTER — Encounter: Payer: Self-pay | Admitting: General Surgery

## 2020-12-16 VITALS — BP 113/77 | HR 71 | Temp 97.1°F | Resp 14 | Ht <= 58 in | Wt 131.0 lb

## 2020-12-16 DIAGNOSIS — K8020 Calculus of gallbladder without cholecystitis without obstruction: Secondary | ICD-10-CM

## 2020-12-16 MED ORDER — FAMOTIDINE 20 MG PO TABS: 20.0000 mg | ORAL_TABLET | Freq: Two times a day (BID) | ORAL | 1 refills | Status: DC

## 2020-12-16 NOTE — Patient Instructions (Addendum)
Take medicine for heartburn. Will refer to GI for possible endoscopy in future.   Tome medicamentos para la acidez estomacal. Se referir a GI para una posible endoscopia en el futuro.

## 2020-12-16 NOTE — Progress Notes (Signed)
[facility]   HPI:  60 y.o. Female presents to clinic for follow-up evaluation after cholecystectomy. she describes improving pain but still with pain in her epigastric region. She no longer has the choking and food getting stuck feeling in the upper esophagus.    She is here today with an interpretor.   Review of Systems:  Tolerating diet No fever or chills All other review of systems: otherwise negative   Vital Signs:  BP 113/77   Pulse 71   Temp (!) 97.1 F (36.2 C) (Other (Comment))   Resp 14   Ht 4\' 7"  (1.397 m)   Wt 131 lb (59.4 kg)   SpO2 98%   BMI 30.45 kg/m    Physical Exam:  Physical Exam Vitals reviewed.  Cardiovascular:     Rate and Rhythm: Normal rate.  Pulmonary:     Effort: Pulmonary effort is normal.  Abdominal:     General: There is no distension.     Palpations: Abdomen is soft.     Tenderness: There is no abdominal tenderness.     Comments: Healing port sites     Assessment:  60 y.o. yo Female with some epigastric pain maybe from reflux/ gastritis but overall better since last week after cholecystectomy.  Plan:  Take pepcid bid for heartburn. Will refer to GI for possible endoscopy in future.  PRN follow up   Future Appointments  Date Time Provider Department Center  12/23/2020  2:45 PM 12/25/2020, MD [facility] None     Wilson Singer, MD [facility] [address] [place], [place] [phone] (office)

## 2020-12-19 ENCOUNTER — Other Ambulatory Visit: Payer: Self-pay | Admitting: Family Medicine

## 2020-12-19 DIAGNOSIS — G8929 Other chronic pain: Secondary | ICD-10-CM

## 2020-12-23 ENCOUNTER — Encounter (INDEPENDENT_AMBULATORY_CARE_PROVIDER_SITE_OTHER): Payer: Self-pay | Admitting: Internal Medicine

## 2020-12-23 ENCOUNTER — Ambulatory Visit (INDEPENDENT_AMBULATORY_CARE_PROVIDER_SITE_OTHER): Payer: 59 | Admitting: Internal Medicine

## 2020-12-23 ENCOUNTER — Other Ambulatory Visit: Payer: Self-pay

## 2020-12-23 VITALS — BP 104/64 | HR 72 | Ht <= 58 in | Wt 131.8 lb

## 2020-12-23 DIAGNOSIS — E785 Hyperlipidemia, unspecified: Secondary | ICD-10-CM

## 2020-12-23 DIAGNOSIS — E119 Type 2 diabetes mellitus without complications: Secondary | ICD-10-CM | POA: Diagnosis not present

## 2020-12-23 DIAGNOSIS — R202 Paresthesia of skin: Secondary | ICD-10-CM

## 2020-12-23 NOTE — Progress Notes (Signed)
Metrics: Intervention Frequency ACO  Documented Smoking Status Yearly  Screened one or more times in 24 months  Cessation Counseling or  Active cessation medication Past 24 months  Past 24 months   Guideline developer: UpToDate (See UpToDate for funding source) Date Released: 2014       Wellness Office Visit  Subjective:  Patient ID: Kayla Marks, female    DOB: [birth year]  Age: 60 y.o. MRN: [id number]  CC: This lady comes in for follow-up regarding her blood work. HPI  I discussed, through an interpreter, her blood work which shows very uncontrolled diabetes with a hemoglobin A1c of over 12%.  She takes Metformin 1000 mg daily.  She is currently postoperative from cholecystectomy and is eating very little food. She describes paresthesia of her right lower leg which initially started with pain and paresthesia in her right buttock going down her leg but now after the cholecystectomy, it seems that the paresthesia is settled in the anterior right lower leg. Past Medical History:  Diagnosis Date  . Bell's palsy    history, approx 2011  . Diabetes mellitus    metformin  . Obesity   . Palpitations   . Retinopathy   . Right eye symptoms    "nerve issue"  . Weakness    Past Surgical History:  Procedure Laterality Date  . ABDOMINAL HYSTERECTOMY  1996  . CESAREAN SECTION  1990  . CHOLECYSTECTOMY N/A 11/26/2020   Procedure: LAPAROSCOPIC CHOLECYSTECTOMY;  Surgeon: Virl Cagey, MD;  Location: [facility];  Service: General;  Laterality: N/A;     Family History  Problem Relation Age of Onset  . Other Father        complications of CVA  . Diabetes Brother   . 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   No regular exercise   Children 3   Education- 12    Originally from [place]- in [place] for 35 years.   Social History   Tobacco Use  . Smoking status: Never Smoker  . Smokeless tobacco: Never Used   Substance Use Topics  . Alcohol use: No    Current Meds  Medication Sig  . famotidine (PEPCID) 20 MG tablet Take 1 tablet (20 mg total) by mouth 2 (two) times daily.  . metFORMIN (GLUCOPHAGE) 1000 MG tablet Take 1,000 mg by mouth in the morning and at bedtime.       Objective:   Today's Vitals: BP 104/64   Pulse 72   Ht '4\' 7"'  (1.397 m)   Wt 131 lb 12.8 oz (59.8 kg)   BMI 30.63 kg/m  Vitals with BMI 12/23/2020 12/16/2020 12/09/2020  Height '4\' 7"'  '4\' 7"'  '4\' 7"'   Weight 131 lbs 13 oz 131 lbs 130 lbs  BMI 30.63 35.36 14.43  Systolic 154 008 676  Diastolic 64 77 73  Pulse 72 71 87     Physical Exam  She is obese, looks systemically well.  Blood pressure is excellent.     Assessment   1. Dyslipidemia   2. Diabetes mellitus without complication ([place])   3. Right leg paresthesias       Tests ordered Orders Placed This Encounter  Procedures  . Ambulatory referral to Neurology     Plan: 1. As far as the diabetes is concerned, I have told her to increase the Metformin to 1000 mg twice a day.  I  have given her diet sheet also. 2. For the right leg paresthesia, I will refer to neurology for further evaluation. 3. Follow-up with Judson Roch in about 3 months.   No orders of the defined types were placed in this encounter.   Doree Albee, MD

## 2020-12-23 NOTE — Patient Instructions (Signed)
[facility] Dietary Recommendations for Weight Loss What to Avoid . Avoid added sugars o Often added sugar can be found in processed foods such as many condiments, dry cereals, cakes, cookies, chips, crisps, crackers, candies, sweetened drinks, etc.  o Read labels and AVOID/DECREASE use of foods with the following in their ingredient list: Sugar, fructose, high fructose corn syrup, sucrose, glucose, maltose, dextrose, molasses, cane sugar, brown sugar, any type of syrup, agave nectar, etc.   . Avoid snacking in between meals . Avoid foods made with flour o If you are going to eat food made with flour, choose those made with whole-grains; and, minimize your consumption as much as is tolerable . Avoid processed foods o These foods are generally stocked in the middle of the grocery store. Focus on shopping on the perimeter of the grocery.  . Avoid Meat  o We recommend following a plant-based diet at [facility]. Thus, we recommend avoiding meat as a general rule. Consider eating beans, legumes, eggs, and/or dairy products for regular protein sources o If you plan on eating meat limit to 4 ounces of meat at a time and choose lean options such as Fish, chicken, turkey. Avoid red meat intake such as pork and/or steak What to Include . Vegetables o GREEN LEAFY VEGETABLES: Kale, spinach, mustard greens, collard greens, cabbage, broccoli, etc. o OTHER: Asparagus, cauliflower, eggplant, carrots, peas, Brussel sprouts, tomatoes, bell peppers, zucchini, beets, cucumbers, etc. . Grains, seeds, and legumes o Beans: kidney beans, black eyed peas, garbanzo beans, black beans, pinto beans, etc. o Whole, unrefined grains: brown rice, barley, bulgur, oatmeal, etc. . Healthy fats  o Avoid highly processed fats such as vegetable oil o Examples of healthy fats: avocado, olives, virgin olive oil, dark chocolate (?72% Cocoa), nuts (peanuts, almonds, walnuts, cashews, pecans, etc.) . None to Low  Intake of Animal Sources of Protein o Meat sources: chicken, turkey, salmon, tuna. Limit to 4 ounces of meat at one time. o Consider limiting dairy sources, but when choosing dairy focus on: PLAIN Greek yogurt, cottage cheese, high-protein milk . Fruit o Choose berries  When to Eat . Intermittent Fasting: o Choosing not to eat for a specific time period, but DO FOCUS ON HYDRATION when fasting o Multiple Techniques: - Time Restricted Eating: eat 3 meals in a day, each meal lasting no more than 60 minutes, no snacks between meals - 16-18 hour fast: fast for 16 to 18 hours up to 7 days a week. Often suggested to start with 2-3 nonconsecutive days per week.  . Remember the time you sleep is counted as fasting.  . Examples of eating schedule: Fast from 7:00pm-11:00am. Eat between 11:00am-7:00pm.  - 24-hour fast: fast for 24 hours up to every other day. Often suggested to start with 1 day per week . Remember the time you sleep is counted as fasting . Examples of eating schedule:  o Eating day: eat 2-3 meals on your eating day. If doing 2 meals, each meal should last no more than 90 minutes. If doing 3 meals, each meal should last no more than 60 minutes. Finish last meal by 7:00pm. o Fasting day: Fast until 7:00pm.  o IF YOU FEEL UNWELL FOR ANY REASON/IN ANY WAY WHEN FASTING, STOP FASTING BY EATING A NUTRITIOUS SNACK OR LIGHT MEAL o ALWAYS FOCUS ON HYDRATION DURING FASTS - Acceptable Hydration sources: water, broths, tea/coffee (black tea/coffee is best but using a small amount of whole-fat dairy products in coffee/tea is acceptable).  -   Poor Hydration Sources: anything with sugar or artificial sweeteners added to it  These recommendations have been developed for patients that are actively receiving medical care from either Dr. Jordell Outten or Sarah Gray, DNP, NP-C at [facility]. These recommendations are developed for patients with specific medical conditions and are not meant to be  distributed or used by others that are not actively receiving care from either provider listed above at [facility]. It is not appropriate to participate in the above eating plans without proper medical supervision.   Reference: Fung, J. The obesity code. [place]: [place]; 2016.   

## 2020-12-24 ENCOUNTER — Encounter: Payer: Self-pay | Admitting: Internal Medicine

## 2021-01-29 ENCOUNTER — Ambulatory Visit: Payer: 59 | Admitting: Internal Medicine

## 2021-02-03 ENCOUNTER — Institutional Professional Consult (permissible substitution): Payer: 59 | Admitting: Diagnostic Neuroimaging

## 2021-02-10 ENCOUNTER — Telehealth: Payer: Self-pay | Admitting: Diagnostic Neuroimaging

## 2021-02-10 ENCOUNTER — Ambulatory Visit: Payer: 59 | Admitting: Diagnostic Neuroimaging

## 2021-02-10 ENCOUNTER — Other Ambulatory Visit: Payer: Self-pay

## 2021-02-10 ENCOUNTER — Encounter: Payer: Self-pay | Admitting: Diagnostic Neuroimaging

## 2021-02-10 VITALS — BP 110/70 | HR 90 | Ht <= 58 in | Wt 129.4 lb

## 2021-02-10 DIAGNOSIS — M79604 Pain in right leg: Secondary | ICD-10-CM | POA: Diagnosis not present

## 2021-02-10 DIAGNOSIS — R29898 Other symptoms and signs involving the musculoskeletal system: Secondary | ICD-10-CM | POA: Diagnosis not present

## 2021-02-10 NOTE — Progress Notes (Signed)
[facility]  PATIENT: Kayla Marks DOB: [birth year]  REFERRING CLINICIAN: Doree Albee, MD HISTORY FROM: patient  REASON FOR VISIT: new consult    HISTORICAL  CHIEF COMPLAINT:  Chief Complaint  Patient presents with  . Right leg paresthesias    Rm 6 interpreter- Ana  "frequent pain in right leg, from buttocks down outer side of leg all the way down; numbness/tingling/weakness, have fallen"    HISTORY OF PRESENT ILLNESS:   60 year old female with diabetes, here for evaluation of right leg pain.  Patient has had right hip and buttock pain radiating to the right leg for past 1 year.  The past few months she is also having increasing numbness and pain from her right knee down to her right foot.  Sometimes her right leg feels weak and gives out.   REVIEW OF SYSTEMS: Full 14 system review of systems performed and negative with exception of: as per HPI.  ALLERGIES: Allergies  Allergen Reactions  . Insulins Nausea And Vomiting    Unknown insulin, injectible Lantus solostar   . Penicillins Rash    HOME MEDICATIONS: Outpatient Medications Prior to Visit  Medication Sig Dispense Refill  . famotidine (PEPCID) 20 MG tablet Take 1 tablet (20 mg total) by mouth 2 (two) times daily. 60 tablet 1  . metFORMIN (GLUCOPHAGE) 1000 MG tablet Take 1,000 mg by mouth in the morning and at bedtime.  0  . gabapentin (NEURONTIN) 100 MG capsule Take 100 mg by mouth 2 (two) times daily.     No facility-administered medications prior to visit.    PAST MEDICAL HISTORY: Past Medical History:  Diagnosis Date  . Bell's palsy    history, approx 2011  . Diabetes mellitus    metformin  . Obesity   . Palpitations   . Retinopathy   . Right eye symptoms    "nerve issue"  . Weakness     PAST SURGICAL HISTORY: Past Surgical History:  Procedure Laterality Date  . ABDOMINAL HYSTERECTOMY  1996  . CESAREAN SECTION  1990  . CHOLECYSTECTOMY N/A 11/26/2020   Procedure:  LAPAROSCOPIC CHOLECYSTECTOMY;  Surgeon: Virl Cagey, MD;  Location: [facility];  Service: General;  Laterality: N/A;    FAMILY HISTORY: Family History  Problem Relation Age of Onset  . Other Father        complications of CVA  . Diabetes Brother   . Other Sister        multiple myeloma    SOCIAL HISTORY: Social History   Socioeconomic History  . Marital status: Married    Spouse name: [place]  . Number of children: Not on file  . Years of education: 18  . Highest education level: Not on file  Occupational History  . Not on file  Tobacco Use  . Smoking status: Never Smoker  . Smokeless tobacco: Never Used  Substance and Sexual Activity  . Alcohol use: No  . Drug use: No  . Sexual activity: Yes    Birth control/protection: Surgical  Other Topics Concern  . 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   No regular exercise   Children 3   Education- 12    Originally from [place]- in [place] years.   Social Determinants of Health   Financial Resource Strain: Not on file  Food Insecurity: Not on file  Transportation Needs: Not on file  Physical Activity: Not on file  Stress: Not on file  Social  Connections: Not on file  Intimate Partner Violence: Not on file     PHYSICAL EXAM  GENERAL EXAM/CONSTITUTIONAL: Vitals:  Vitals:   02/10/21 0912  BP: 110/70  Pulse: 90  Weight: 129 lb 6.4 oz (58.7 kg)  Height: '4\' 2"'  (1.27 m)   Body mass index is 36.39 kg/m. Wt Readings from Last 3 Encounters:  02/10/21 129 lb 6.4 oz (58.7 kg)  12/23/20 131 lb 12.8 oz (59.8 kg)  12/16/20 131 lb (59.4 kg)    Patient is in no distress; well developed, nourished and groomed; neck is supple  CARDIOVASCULAR:  Examination of carotid arteries is normal; no carotid bruits  Regular rate and rhythm, no murmurs  Examination of peripheral vascular system by observation and palpation is normal  EYES:  Ophthalmoscopic exam of optic discs and  posterior segments is normal; no papilledema or hemorrhages No exam data present  MUSCULOSKELETAL:  Gait, strength, tone, movements noted in Neurologic exam below  NEUROLOGIC: MENTAL STATUS:  No flowsheet data found.  awake, alert, oriented to person, place and time  recent and remote memory intact  normal attention and concentration  language fluent, comprehension intact, naming intact  fund of knowledge appropriate  CRANIAL NERVE:   2nd - no papilledema on fundoscopic exam  2nd, 3rd, 4th, 6th - pupils equal and reactive to light, visual fields full to confrontation, extraocular muscles intact, no nystagmus  5th - facial sensation symmetric  7th - facial strength symmetric  8th - hearing intact  9th - palate elevates symmetrically, uvula midline  11th - shoulder shrug symmetric  12th - tongue protrusion midline  MOTOR:   normal bulk and tone, full strength in the BUE, BLE; EXCEPT RIGHT HIP FLEX 4+  SENSORY:   normal and symmetric to light touch, temperature, vibration; EXCEPT DECR IN RIGHT HAND AND RIGHT FOOT  COORDINATION:   finger-nose-finger, fine finger movements normal  REFLEXES:   deep tendon reflexes TRACE and symmetric  GAIT/STATION:   narrow based gait; SLIGHTLY ANTALGIC GAIT ON RIGHT LEG     DIAGNOSTIC DATA (LABS, IMAGING, TESTING) - I reviewed patient records, labs, notes, testing and imaging myself where available.  Lab Results  Component Value Date   WBC 7.1 11/14/2020   HGB 15.7 (H) 11/14/2020   HCT 45.5 11/14/2020   MCV 88.3 11/14/2020   PLT 293 11/14/2020      Component Value Date/Time   NA 135 12/04/2020 1738   K 4.4 12/04/2020 1738   CL 100 12/04/2020 1738   CO2 24 12/04/2020 1738   GLUCOSE 280 (H) 12/04/2020 1738   BUN 9 12/04/2020 1738   CREATININE 0.49 (L) 12/04/2020 1738   CALCIUM 9.0 12/04/2020 1738   PROT 6.5 12/04/2020 1738   ALBUMIN 4.3 11/14/2020 1755   AST 19 12/04/2020 1738   ALT 22 12/04/2020 1738    ALKPHOS 78 11/14/2020 1755   BILITOT 0.5 12/04/2020 1738   GFRNONAA 107 12/04/2020 1738   GFRAA 124 12/04/2020 1738   Lab Results  Component Value Date   CHOL 122 12/04/2020   HDL 29 (L) 12/04/2020   LDLCALC 72 12/04/2020   TRIG 119 12/04/2020   CHOLHDL 4.2 12/04/2020   Lab Results  Component Value Date   HGBA1C 12.3 (H) 12/04/2020   No results found for: VITAMINB12 Lab Results  Component Value Date   TSH 0.82 12/04/2020    10/08/14 MRI brain - No acute abnormality. - Small frontal white matter hyperintensities bilaterally. This may be related to chronic microvascular ischemia  or migraine headache. Pattern is not typical for demyelinating disease.   ASSESSMENT AND PLAN  60 y.o. year old female here with:   Dx:  1. Right leg weakness   2. Right leg pain      PLAN:  RIGHT LEG PAIN / NUMBNESS / WEAKNESS (lumbar radiculopathy vs diabetic lumbosacral plexopathy) - check labs - MRI lumbar spine --> rule out lumbar radiculopathy - then may consider EMG/NCS - may increase gabapentin up to 379m twice a day  - uncontrolled diabetes (A1c 12.3 --> needs better control)  Orders Placed This Encounter  Procedures  . MR LUMBAR SPINE WO CONTRAST  . Hemoglobin A1c  . Vitamin B12   Return for pending if symptoms worsen or fail to improve. pending test results    VPenni Bombard MD 41/02/6430 94:27AM Certified in Neurology, Neurophysiology and [facility] [address] [place] [phone]

## 2021-02-10 NOTE — Telephone Encounter (Signed)
[employer] Berkley Harvey: [id number] (exp. 02/10/21 to 03/27/21) order sent to GI. They will reach out to the patient to schedule.

## 2021-02-10 NOTE — Patient Instructions (Signed)
RIGHT LEG PAIN / NUMBNESS / WEAKNESS (lumbar radiculopathy vs diabetic lumbosacral plexopathy) - MRI lumbar spine --> rule out lumbar radiculopathy - then may consider EMG/NCS (nerve testing) - may increase gabapentin up to 300mg  twice a day

## 2021-02-11 LAB — VITAMIN B12: Vitamin B-12: >2000 pg/mL — ABNORMAL HIGH (ref 232–1245)

## 2021-02-11 LAB — HEMOGLOBIN A1C
Est. average glucose Bld gHb Est-mCnc: 278 mg/dL
Hgb A1c MFr Bld: 11.3 % — ABNORMAL HIGH (ref 4.8–5.6)

## 2021-02-12 ENCOUNTER — Telehealth: Payer: Self-pay | Admitting: *Deleted

## 2021-02-12 NOTE — Telephone Encounter (Signed)
Called patient and informed her that her A1c is still high, but trending better. B12 is ok. Patient verbalized understanding, appreciation.

## 2021-02-24 ENCOUNTER — Other Ambulatory Visit: Payer: 59

## 2021-02-25 ENCOUNTER — Ambulatory Visit
Admission: RE | Admit: 2021-02-25 | Discharge: 2021-02-25 | Disposition: A | Discharge status: Home / Self Care | Payer: 59 | Source: Ambulatory Visit | Attending: Diagnostic Neuroimaging | Admitting: Diagnostic Neuroimaging

## 2021-02-25 ENCOUNTER — Other Ambulatory Visit: Payer: Self-pay

## 2021-02-25 DIAGNOSIS — M79604 Pain in right leg: Secondary | ICD-10-CM

## 2021-02-25 DIAGNOSIS — R29898 Other symptoms and signs involving the musculoskeletal system: Secondary | ICD-10-CM | POA: Diagnosis not present

## 2021-03-03 ENCOUNTER — Encounter (INDEPENDENT_AMBULATORY_CARE_PROVIDER_SITE_OTHER): Payer: Self-pay

## 2021-03-25 ENCOUNTER — Ambulatory Visit (INDEPENDENT_AMBULATORY_CARE_PROVIDER_SITE_OTHER): Payer: 59 | Admitting: Nurse Practitioner

## 2021-03-31 ENCOUNTER — Ambulatory Visit (INDEPENDENT_AMBULATORY_CARE_PROVIDER_SITE_OTHER): Payer: 59 | Admitting: Nurse Practitioner

## 2021-03-31 ENCOUNTER — Other Ambulatory Visit: Payer: Self-pay

## 2021-03-31 ENCOUNTER — Encounter (INDEPENDENT_AMBULATORY_CARE_PROVIDER_SITE_OTHER): Payer: Self-pay | Admitting: Nurse Practitioner

## 2021-03-31 VITALS — BP 112/68 | HR 76 | Temp 96.5°F | Ht <= 58 in | Wt 136.2 lb

## 2021-03-31 DIAGNOSIS — M48061 Spinal stenosis, lumbar region without neurogenic claudication: Secondary | ICD-10-CM

## 2021-03-31 DIAGNOSIS — E1165 Type 2 diabetes mellitus with hyperglycemia: Secondary | ICD-10-CM

## 2021-03-31 MED ORDER — TRULICITY 0.75 MG/0.5ML ~~LOC~~ SOAJ: 0.7500 mg | SUBCUTANEOUS | 3 refills | Status: DC

## 2021-03-31 NOTE — Progress Notes (Signed)
Subjective:  Patient ID: Kayla Marks, female    DOB: [birth year]  Age: 60 y.o. MRN: [id number]  CC:  Chief Complaint  Patient presents with  . Diabetes  . Other    Right leg weakness and numbness      HPI  This patient arrives today for the above.  Interpreter present for entirety of visit.  Diabetes: Per chart review it appears that patient's metformin was increased from 1000 mg daily to 1000 g twice a day at last office visit.  However the patient reports that she is always been on 1000 mg twice a day.  Her A1c was collected about 1 month ago and was 11.3.  She used to be on insulin but is no longer on this.  She reports her fasting blood sugars at home range between 200-2 30.  She is not on statin or ACE/ARB.  Right leg weakness/numbness: She does report that she has right leg weakness and numbness.  I do see where she is been evaluated by neurology for this and did undergo MRI last month which did show spinal stenosis of the lumbar spine.  She tells me she does not have a follow-up with neurology currently.  Past Medical History:  Diagnosis Date  . Bell's palsy    history, approx 2011  . Diabetes mellitus    metformin  . Obesity   . Palpitations   . Retinopathy   . Right eye symptoms    "nerve issue"  . Weakness       Family History  Problem Relation Age of Onset  . Other Father        complications of CVA  . Diabetes Brother   . 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   No regular exercise   Children 3   Education- 12    Originally from [place]- in [place] for [place] years.   Social History   Tobacco Use  . Smoking status: Never Smoker  . Smokeless tobacco: Never Used  Substance Use Topics  . Alcohol use: No     Current Meds  Medication Sig  . Dulaglutide (TRULICITY) 7.32 KG/2.5KY SOPN Inject 0.75 mg into the skin once a week.  . famotidine (PEPCID) 20 MG tablet Take 1  tablet (20 mg total) by mouth 2 (two) times daily.  Marland Kitchen gabapentin (NEURONTIN) 100 MG capsule Take 100 mg by mouth 2 (two) times daily.  . metFORMIN (GLUCOPHAGE) 1000 MG tablet Take 1,000 mg by mouth in the morning and at bedtime.    ROS:  Review of Systems  Respiratory: Negative for shortness of breath.   Cardiovascular: Negative for chest pain.  Neurological: Positive for sensory change and weakness.     Objective:   Today's Vitals: BP 112/68   Pulse 76   Temp (!) 96.5 F (35.8 C)   Ht '4\' 7"'  (1.397 m)   Wt 136 lb 3.2 oz (61.8 kg)   SpO2 97%   BMI 31.66 kg/m  Vitals with BMI 03/31/2021 02/10/2021 12/23/2020  Height '4\' 7"'  '4\' 2"'  '4\' 7"'   Weight 136 lbs 3 oz 129 lbs 6 oz 131 lbs 13 oz  BMI 31.66 70.62 37.62  Systolic 831 517 616  Diastolic 68 70 64  Pulse 76 90 72     Physical Exam Vitals reviewed.  Constitutional:      General: She is  not in acute distress.    Appearance: Normal appearance.  HENT:     Head: Normocephalic and atraumatic.  Neck:     Vascular: No carotid bruit.  Cardiovascular:     Rate and Rhythm: Normal rate and regular rhythm.     Pulses: Normal pulses.     Heart sounds: Normal heart sounds.  Pulmonary:     Effort: Pulmonary effort is normal.     Breath sounds: Normal breath sounds.  Skin:    General: Skin is warm and dry.  Neurological:     General: No focal deficit present.     Mental Status: She is alert and oriented to person, place, and time.     Sensory: Sensory deficit (right sided reduced sensation) present.     Motor: Motor function is intact.  Psychiatric:        Mood and Affect: Mood normal.        Behavior: Behavior normal.        Judgment: Judgment normal.          Assessment and Plan   1. Type 2 diabetes mellitus with hyperglycemia, without long-term current use of insulin (HCC)   2. Spinal stenosis of lumbar region, unspecified whether neurogenic claudication present      Plan: 1.  Per shared decision making we will  trial Trulicity as opposed to restarting insulin.  She denies personal family history of thyroid cancer or personal history of pancreatitis.  We did discuss common negative side effects and what to do this for occur.  We also discussed hypoglycemia and how to treat this if this were to occur.  She was told to continue taking her blood sugars at home.  She will follow-up in 1 month to see how her blood sugars are being affected by the Trulicity to make sure she is tolerating it well.  We did use a demonstration needle today to show her how to administer the Trulicity and she did demonstrate how to use it correctly.  We will also need to consider starting her on statin therapy and ACE or ARB in the near future.  We will discuss this at subsequent visit in 1 month. 2.  I recommended she follow-up with her neurologist but I also offered to refer her to neurosurgeon due to her complaints of weakness and numbness.  Worse on the right side as compared to left side.  She would like to have a referral to neurosurgeon so I will order this today.   Tests ordered Orders Placed This Encounter  Procedures  . Ambulatory referral to Neurosurgery      Meds ordered this encounter  Medications  . Dulaglutide (TRULICITY) 0.62 IR/4.8NI SOPN    Sig: Inject 0.75 mg into the skin once a week.    Dispense:  4 each    Refill:  3    Order Specific Question:   Supervising Provider    Answer:   Doree Albee [[id number]]    Patient to follow-up in 1 month or sooner as needed.  Ailene Ards, NP

## 2021-03-31 NOTE — Patient Instructions (Signed)
TRULICITY (Dulaglutide) Injection Qu es este medicamento? La DULAGLUTIDA controla los niveles de azcar en la sangre en personas con diabetes tipo 2. Se [place] con cambios en el estilo de vida tales como dieta y ejercicio fsico. Podra disminuir el riesgo de problemas que necesiten tratamiento en el hospital. Estos problemas incluyen ataque cardiaco o accidente cerebrovascular. Este medicamento puede ser utilizado para otros usos; si tiene alguna pregunta consulte con su proveedor de atencin mdica o con su farmacutico. MARCAS COMUNES: Trulicity Qu le debo informar a mi profesional de la salud antes de tomar este medicamento? Necesitan saber si usted presenta alguno de los [employer] o situaciones: tumores endocrinos (neoplasia endcrina mltiple tipo II) o si alguien en su familiar tuvo esos tumores enfermedad ocular, problemas de la visin antecedentes de pancreatitis enfermedad renal enfermedad heptica problemas estomacales o intestinales cncer de tiroides o si alguien en su familia tuvo cncer de tiroides una reaccin alrgica o inusual a la dulaglutida, a otros medicamentos, alimentos, colorantes o conservantes si est embarazada o buscando quedar embarazada si est amamantando a un beb Cmo debo utilizar este medicamento? Este medicamento se inyecta debajo de la piel. Le ensearn cmo prepararlo y administrarlo. selo segn las instrucciones en la etiqueta el mismo da todas las semanas. NO presione repetidamente el inyector. Siga usndolo a menos que su proveedor de Insurance risk surveyor indique dejar de [place]. Si [place] este medicamento con insulina, debe [employer] medicamento y la insulina por separado. No los mezcle. No se aplique una inyeccin al lado de la otra. Cambie (rote) los sitios de inyeccin con cada inyeccin. Este frmaco viene con INSTRUCCIONES DE USO. Pdale a su farmacutico que le indique cmo usar [employer]. Lea la informacin atentamente. Hable con su  farmacutico o su proveedor de atencin mdica si tiene alguna pregunta. Es importante que deseche las agujas y las jeringas usadas en un recipiente resistente a los pinchazos. No las deseche en la basura. Si no tiene un recipiente resistente a los pinchazos, llame a su farmacutico o proveedor de atencin de la salud para obtenerlo. Su farmacutico le dar una Gua del medicamento especial (MedGuide, nombre en ingls) con cada receta y en cada ocasin que la vuelva a surtir. Asegrese de leer esta informacin cada vez cuidadosamente. Hable con su proveedor de atencin mdica sobre el uso de este medicamento en nios. Puede requerir atencin especial. Sobredosis: Pngase en contacto inmediatamente con un centro toxicolgico o una sala de urgencia si usted cree que haya tomado demasiado medicamento. ATENCIN: [employer] es solo para usted. No comparta este medicamento con nadie. Qu sucede si me olvido de una dosis? Si olvida una dosis, adminstrela lo antes posible, a menos que hayan pasado ms de [address]  [address]. Si han pasado ms de 3 das desde el momento de administracin habitual, omita la dosis que se olvid. Administre la prxima dosis a la hora habitual. Qu puede interactuar con este medicamento? otros medicamentos para la diabetes Muchos medicamentos pueden causar Probation officer de Banker. Estos incluyen: bebidas con alcohol medicamentos antivirales para el VIH o SIDA aspirina y medicamentos tipo aspirina ciertos medicamentos para la presin arterial, enfermedad cardiaca y frecuencia cardiaca irregular cromo diurticos hormonas femeninas, tales como estrgenos o progestinas, pldoras anticonceptivas fenofibrato gemfibrozil isoniazida lanreotida hormonas masculinas o esteroides anablicos IMAO, tales como Carbex, Eldepryl, Marplan, Nardil y Parnate medicamentos para alergias, asma, resfros o tos medicamentos para la depresin, ansiedad o trastornos psicticos medicamentos para  bajar de peso niacina nicotina  AINE, medicamentos para el dolor y la inflamacin, tales como ibuprofeno o naproxeno octreotida pasireotida pentamidina fenitona probenecid antibiticos del grupo de las quinolonas, tales como ciprofloxacino, levofloxacino y ofloxacino algunos suplementos dietticos a base de hierbas medicamentos esteroideos, tales como la prednisona o la cortisona sulfametoxasol; trimetoprima hormonas tiroideas Algunos medicamentos pueden ocultar los sntomas de advertencia de niveles bajos de Banker (hipoglucemia). Es posible que deba monitorear ms atentamente su nivel de azcar en la sangre si est tomando uno de estos medicamentos. Estos medicamentos incluyen: betabloqueadores, que con frecuencia se usan para la presin arterial alta o problemas cardiacos (algunos ejemplos son atenolol, metoprolol y propranolol) clonidina guanetidina reserpina Puede ser que esta lista no menciona todas las posibles interacciones. Informe a su profesional de [employer] de [employer] productos a base de hierbas, medicamentos de [place] o suplementos nutritivos que est tomando. Si usted fuma, consume bebidas alcohlicas o si utiliza drogas ilegales, indqueselo tambin a su profesional de [employer]. Algunas sustancias pueden interactuar con su medicamento. A qu debo estar atento al usar [employer]? Visite a su proveedor de atencin mdica para que revise regularmente su evolucin. Consulte con su proveedor de atencin mdica si tiene diarrea grave, nuseas y vmitos, o sudoracin intensa. La prdida de demasiado lquido corporal puede hacer que sea peligroso usar [employer]. Se monitorizar una prueba llamada Hemoglobina A1C (A1C). Es un anlisis de sangre sencillo. Mide su control del nivel de azcar en la sangre durante los ltimos 2 a 3 meses. Se le realizar esta prueba cada 3 a 6 meses. Aprenda a revisar su nivel de azcar en la sangre. Conozca los sntomas del nivel bajo y  alto de International aid/development worker en la sangre y cmo controlarlos. Siempre lleve con usted una fuente rpida de azcar por si tiene sntomas de nivel bajo de azcar [employer]. Algunos ejemplos incluyen caramelos duros de azcar o tabletas de glucosa. Asegrese de que otras personas sepan que usted se puede ahogar si come o bebe cuando presenta sntomas graves de [place] bajo de azcar en la sangre, como convulsiones o prdida del conocimiento. Debe obtener ayuda mdica de inmediato. Informe a su proveedor de atencin mdica si tiene niveles altos de Banker. Es posible que deba cambiar la dosis de su medicamento. Si est enfermo o hace ms ejercicio que lo habitual, es posible que necesite cambiar la dosis de su medicamento. No saltee comidas. Pregunte a su proveedor de atencin mdica si debe evitar el alcohol. Muchos productos de venta libre para la tos y el resfro contienen azcar o alcohol. Estos pueden afectar los niveles de Banker. Los inyectores nunca deben compartirse. Incluso si se cambia la aguja, al compartir se pueden contagiar virus como la hepatitis o el VIH. Use un brazalete o una cadena de identificacin mdica. Lleve consigo una tarjeta que describa su afeccin. Incluya en la tarjeta una lista de los medicamentos y las dosis que [place]. Qu efectos secundarios puedo tener al [employer] este medicamento? Efectos secundarios que debe informar a su mdico o a Producer, television/film/video de la salud tan pronto como sea posible: Therapist, art (erupcin cutnea, comezn/picazn o urticaria; hinchazn de la cara, los labios o la lengua) cambios en la visin diarrea que contina o es grave infeccin (fiebre, escalofros, tos, Engineer, mining de Advertising copywriter, Engineer, mining o dificultad para Geographical information systems officer) lesin renal (dificultad para orinar o cambios en la cantidad de orina) niveles bajos de azcar en la sangre (ansiedad; confusin; mareos; aumento del  apetito; debilidad o cansancio inusuales; aumento de la sudoracin;  temblores; piel fra y sudorosa; irritabilidad; dolor de cabeza; visin borrosa; frecuencia cardiaca rpida; prdida del conocimiento) bulto o hinchazn en el cuello problemas para respirar problemas para tragar dolor o malestar estomacal inusual vmito Efectos secundarios que generalmente no requieren atencin mdica (infrmelos a su mdico o a su profesional de la salud si persisten o si son molestos): falta o prdida del apetito nuseas dolor, enrojecimiento o Marketing executive de la inyeccin Puede ser que esta lista no menciona todos los posibles efectos secundarios. Comunquese a su mdico por asesoramiento mdico [employer]. Usted puede informar los efectos secundarios a la FDA por telfono al 1-800-FDA-1088. Dnde debo guardar mi medicina? Mantenga fuera del alcance de nios y Neurosurgeon. Refrigeracin (preferido): Guarde los inyectores sin abrir en el refrigerador a una temperatura de [place] 2 y 8 grados Celsius (entre 36 y 46 grados Fahrenheit). Mantenga en el envase original hasta que est listo para usarlo. No congele ni utilice si el medicamento ha Sara Lee. Proteja de Statistician. Deseche todo el medicamento que no haya utilizado despus de la fecha de vencimiento en la etiqueta. Temperatura ambiente: Probation officer se puede guardar a Marketing executive ambiente inferior a 30 grados Celsius (86 grados Fahrenheit) por hasta un total de [address], si fuera necesario. Proteja de Statistician. Evite exponer al calor extremo. Si se almacena a Marketing executive ambiente, deseche todo el medicamento sin usar despus de [address] o despus de la fecha de vencimiento, lo que suceda primero. Para desechar el medicamento que ya no necesite o que est vencido: Lleve el medicamento a un programa de recuperacin de medicamentos. Consulte con su farmacia o con una entidad reguladora para encontrar un lugar donde llevarlo. Si no puede Government social research officer, pregntele a Film/video editor o proveedor de  atencin mdica cmo desecharlo de IT consultant. ATENCIN: Este folleto es un resumen. Puede ser que no cubra toda la posible informacin. Si usted tiene preguntas acerca de esta medicina, consulte con su mdico, su farmacutico o su profesional de Radiographer, therapeutic.  2021 Elsevier/Gold Standard (2020-10-08 00:00:00)

## 2021-04-01 ENCOUNTER — Telehealth: Payer: Self-pay | Admitting: *Deleted

## 2021-04-01 NOTE — Telephone Encounter (Signed)
LVM for son, Onalee Hua on [place] to call back for results on patient.

## 2021-04-07 ENCOUNTER — Encounter: Payer: Self-pay | Admitting: *Deleted

## 2021-04-21 ENCOUNTER — Other Ambulatory Visit: Payer: Self-pay

## 2021-04-21 ENCOUNTER — Encounter (INDEPENDENT_AMBULATORY_CARE_PROVIDER_SITE_OTHER): Payer: Self-pay | Admitting: Internal Medicine

## 2021-04-21 ENCOUNTER — Ambulatory Visit (INDEPENDENT_AMBULATORY_CARE_PROVIDER_SITE_OTHER): Payer: 59 | Admitting: Internal Medicine

## 2021-04-21 VITALS — BP 106/68 | HR 74 | Temp 97.5°F | Ht <= 58 in | Wt 136.4 lb

## 2021-04-21 DIAGNOSIS — L600 Ingrowing nail: Secondary | ICD-10-CM

## 2021-04-21 MED ORDER — LEVOFLOXACIN 500 MG PO TABS: 500.0000 mg | ORAL_TABLET | Freq: Every day | ORAL | 0 refills | Status: AC

## 2021-04-21 MED ORDER — METFORMIN HCL 1000 MG PO TABS: 1000.0000 mg | ORAL_TABLET | Freq: Two times a day (BID) | ORAL | 0 refills | Status: DC

## 2021-04-21 NOTE — Progress Notes (Signed)
Metrics: Intervention Frequency ACO  Documented Smoking Status Yearly  Screened one or more times in 24 months  Cessation Counseling or  Active cessation medication Past 24 months  Past 24 months   Guideline developer: UpToDate (See UpToDate for funding source) Date Released: 2014       Wellness Office Visit  Subjective:  Patient ID: Kayla Marks, female    DOB: [birth year]  Age: 60 y.o. MRN: [id number]  CC: Infected right big toe ingrowing toenail HPI  The patient comes in for an acute visit with the above symptom of pain in the right big toe nail with probable ingrown toenail.  She does not describe fever.  She is a diabetic.  She is allergic to penicillin. Past Medical History:  Diagnosis Date   Bell's palsy    history, approx 2011   Diabetes mellitus    metformin   Obesity    Palpitations    Retinopathy    Right eye symptoms    "nerve issue"   Weakness    Past Surgical History:  Procedure Laterality Date   [place] N/A 11/26/2020   Procedure: LAPAROSCOPIC CHOLECYSTECTOMY;  Surgeon: Virl Cagey, MD;  Location: [facility];  Service: General;  Laterality: N/A;   GALLBLADDER SURGERY     February 2022 possibly?     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   No regular exercise   Children 3   Education- 12    Originally from [place]- in [place] for 35 years.   Social History   Tobacco Use   Smoking status: Never   Smokeless tobacco: Never  Substance Use Topics   Alcohol use: No    Current Meds  Medication Sig   Dulaglutide (TRULICITY) 4.19 QQ/2.2LN SOPN Inject 0.75 mg into the skin once a week.   famotidine (PEPCID) 20 MG tablet Take 1 tablet (20 mg total) by mouth 2 (two) times daily.   gabapentin  (NEURONTIN) 100 MG capsule Take 100 mg by mouth 2 (two) times daily.   levofloxacin (LEVAQUIN) 500 MG tablet Take 1 tablet (500 mg total) by mouth daily for 7 days.   [DISCONTINUED] metFORMIN (GLUCOPHAGE) 1000 MG tablet Take 1,000 mg by mouth in the morning and at bedtime.     [place] Office Visit from 04/21/2021 in [facility]  PHQ-9 Total Score 0       Objective:   Today's Vitals: BP 106/68   Pulse 74   Temp (!) 97.5 F (36.4 C) (Temporal)   Ht _0  (1.397 m)   Wt 136 lb 6.4 oz (61.9 kg)   SpO2 99%   BMI 31.70 kg/m  Vitals with BMI 04/21/2021 03/31/2021 02/10/2021  Height _1  _2  _3   Weight 136 lbs 6 oz 136 lbs 3 oz 129 lbs 6 oz  BMI 31.7 98.92 11.94  Systolic 174 081 448  Diastolic 68 68 70  Pulse 74 76 90     Physical Exam   She looks systemically well.  She is afebrile.  She does appear to have some surrounding cellulitis of the right ingrown toenail of the big toe.  Assessment   1. Ingrowing toenail       Tests ordered Orders Placed This Encounter  Procedures   Ambulatory referral to Podiatry      Plan: 1.  I am going to empirically start her on Levaquin for possible infection/cellulitis. 2.  I will refer to Dr. Posey Pronto, podiatry in [place]. 3.  Follow-up with Judson Roch as previously scheduled.    Meds ordered this encounter  Medications   metFORMIN (GLUCOPHAGE) 1000 MG tablet    Sig: Take 1 tablet (1,000 mg total) by mouth in the morning and at bedtime.    Dispense:  180 tablet    Refill:  0   levofloxacin (LEVAQUIN) 500 MG tablet    Sig: Take 1 tablet (500 mg total) by mouth daily for 7 days.    Dispense:  7 tablet    Refill:  0     Veryl Abril Luther Parody, MD

## 2021-04-28 ENCOUNTER — Ambulatory Visit (HOSPITAL_COMMUNITY): Payer: 59 | Attending: Neurosurgery | Admitting: Physical Therapy

## 2021-04-30 ENCOUNTER — Ambulatory Visit (INDEPENDENT_AMBULATORY_CARE_PROVIDER_SITE_OTHER): Payer: 59 | Admitting: Nurse Practitioner

## 2021-04-30 ENCOUNTER — Encounter (INDEPENDENT_AMBULATORY_CARE_PROVIDER_SITE_OTHER): Payer: Self-pay | Admitting: Nurse Practitioner

## 2021-05-06 ENCOUNTER — Encounter (INDEPENDENT_AMBULATORY_CARE_PROVIDER_SITE_OTHER): Payer: Self-pay | Admitting: Internal Medicine

## 2021-05-21 ENCOUNTER — Encounter (HOSPITAL_COMMUNITY): Payer: Self-pay | Admitting: Physical Therapy

## 2021-05-21 ENCOUNTER — Ambulatory Visit (HOSPITAL_COMMUNITY): Payer: 59 | Attending: Neurosurgery | Admitting: Physical Therapy

## 2021-05-21 ENCOUNTER — Other Ambulatory Visit: Payer: Self-pay

## 2021-05-21 DIAGNOSIS — M5441 Lumbago with sciatica, right side: Secondary | ICD-10-CM | POA: Insufficient documentation

## 2021-05-21 DIAGNOSIS — R262 Difficulty in walking, not elsewhere classified: Secondary | ICD-10-CM

## 2021-05-21 DIAGNOSIS — G8929 Other chronic pain: Secondary | ICD-10-CM | POA: Diagnosis present

## 2021-05-21 DIAGNOSIS — M6281 Muscle weakness (generalized): Secondary | ICD-10-CM | POA: Insufficient documentation

## 2021-05-21 NOTE — Therapy (Signed)
[facility] [address] [place], [place], [address] Phone: [phone]   Fax:  [phone]  Physical Therapy Evaluation  Patient Details  Name: Kayla Marks MRN: [id number] Date of Birth: 12/24/1960 Referring Provider (PT): Lisbeth Renshaw MD   Encounter Date: 05/21/2021   PT End of Session - 05/21/21 1324     Visit Number 1    Number of Visits 8    Date for PT Re-Evaluation 06/11/21    Authorization Type united healthcare, VL 24 with 0 used, no auth req.    Authorization - Visit Number 1    Authorization - Number of Visits 24    PT Start Time [phone]    PT Stop Time 0911    PT Time Calculation (min) 33 min    Activity Tolerance Patient limited by pain             Past Medical History:  Diagnosis Date   Bell's palsy    history, approx 2011   Diabetes mellitus    metformin   Obesity    Palpitations    Retinopathy    Right eye symptoms    "nerve issue"   Weakness     Past Surgical History:  Procedure Laterality Date   ABDOMINAL HYSTERECTOMY  1996   CESAREAN SECTION  1990   CHOLECYSTECTOMY N/A 11/26/2020   Procedure: LAPAROSCOPIC CHOLECYSTECTOMY;  Surgeon: Lucretia Roers, MD;  Location: [facility];  Service: General;  Laterality: N/A;   GALLBLADDER SURGERY     February 2022 possibly?    There were no vitals filed for this visit.    Subjective Assessment - 05/21/21 0925     Subjective States that her right leg hurts from her butt to the foot but pain does not go into the foot. States that the part from the knee to the foot feels numb. Pain has been going on for over 2 years but has recently worsened in the last couple months. States that when she walks she feels like she doesn't have any strength and that she has fallen a couple of times. Reports her MD said she might need surgery which she doesn't want. currently she can walk about 10-15 minutes before her foot gives way.    Limitations  Lifting;Walking;Standing;House hold activities    How long can you walk comfortably? 10-15 minutes    Diagnostic tests MRI - no sig findings    Patient Stated Goals to not have surgery    Currently in Pain? Yes    Pain Score 6     Pain Location Back    Pain Orientation Right;Lower    Pain Descriptors / Indicators Aching;Tingling;Tender    Pain Radiating Towards doiwn righ tleg to the foot    Pain Onset More than a month ago    Pain Frequency Constant    Aggravating Factors  standing and walking    Pain Relieving Factors rest    Effect of Pain on Daily Activities unable to perform all tasks                [facility] PT Assessment - 05/21/21 0001       Assessment   Medical Diagnosis LBP    Referring Provider (PT) Lisbeth Renshaw MD    Onset Date/Surgical Date --   over 2 years ago   Next MD Visit --   4 weeks from original apt   Prior Therapy no      Balance Screen   Has  the patient fallen in the past 6 months Yes    How many times? 3    Has the patient had a decrease in activity level because of a fear of falling?  Yes    Is the patient reluctant to leave their home because of a fear of falling?  Yes      Prior Function   Level of Independence Independent      Cognition   Overall Cognitive Status Within Functional Limits for tasks assessed      Observation/Other Assessments   Focus on Therapeutic Outcomes (FOTO)  36% functoin      ROM / Strength   AROM / PROM / Strength AROM;Strength      AROM   AROM Assessment Site Lumbar;Hip    Right/Left Hip Right;Left    Right Hip Flexion --   WNL   Right Hip External Rotation  30    Right Hip Internal Rotation  30    Left Hip Flexion --   WNL   Left Hip External Rotation  15    Left Hip Internal Rotation  20    Lumbar Flexion 50% % limited   pulls down back of the right leg   Lumbar Extension 75 %limited    Lumbar - Right Side Bend 25% limited   no change in symptoms   Lumbar - Left Side Bend 25% limited   no change in  symptoms     Strength   Strength Assessment Site Hip;Ankle;Knee    Right/Left Hip Right;Left    Right Hip Extension 4-/5   painin back   Left Hip Extension 4/5    Right/Left Knee Left;Right    Right Knee Flexion 4/5    Right Knee Extension 4+/5    Left Knee Flexion 4/5    Left Knee Extension 4+/5    Right/Left Ankle Left;Right    Right Ankle Dorsiflexion 4+/5    Left Ankle Dorsiflexion 5/5      Flexibility   Soft Tissue Assessment /Muscle Length yes    Hamstrings WNL      Palpation   Spinal mobility hypomobility noted in lumbar spine with spring testing    Palpation comment tenderness to palpation along right piriformis with radicular symptoms      Special Tests    Special Tests Lumbar    Lumbar Tests Prone Knee Bend Test;Straight Leg Raise      Prone Knee Bend Test   Findings Positive    Side Right    Comment hip hike, painful      Straight Leg Raise   Findings Negative    Comment bilaterally                        Objective measurements completed on examination: See above findings.       [facility] Adult PT Treatment/Exercise - 05/21/21 0001       Exercises   Exercises Lumbar      Lumbar Exercises: Stretches   Single Knee to Chest Stretch 10 seconds;Right   x10                   PT Education - 05/21/21 0915     Education Details on current condition, on HEP, on anatomy and POC    Person(s) Educated Patient;Other (comment)   interperter   Methods Explanation    Comprehension Verbalized understanding              PT Short Term Goals -  05/21/21 0918       PT SHORT TERM GOAL #1   Title Patient will report at least 25% improvement in overall symptoms and/or function to demonstrate improved functional mobility    Time 2    Period Weeks    Status New    Target Date 06/04/21      PT SHORT TERM GOAL #2   Title Patient will be independent in self management strategies to improve quality of life and functional outcomes.    Time  2    Period Weeks    Status New    Target Date 06/04/21      PT SHORT TERM GOAL #3   Title Patient will report being able to stand and walk around for at least 25 minutes at a time to improve ability to walk in the community    Time 2    Period Weeks    Status New    Target Date 06/04/21               PT Long Term Goals - 05/21/21 0919       PT LONG TERM GOAL #1   Title Patient will be able to stand and walk around for at least 45 minutes without needed to sit due to pain and weakness to improve ability to perform functional tasks.    Time 4    Period Weeks    Status New    Target Date 06/18/21      PT LONG TERM GOAL #2   Title Patient will improve on FOTO score to meet predicted outcomes to demonstrate improved functional mobility.    Time 4    Period Weeks    Status New    Target Date 06/18/21      PT LONG TERM GOAL #3   Title Patient will report at least 75% improvement in overall symptoms and/or function to demonstrate improved functional mobility    Time 4    Period Weeks    Status New    Target Date 06/18/21                    Plan - 05/21/21 0916     Clinical Impression Statement Patient is a 60 y.o. female who presents to physical therapy with complaint of chronic low back and right leg pain. Patient demonstrates decreased strength, ROM restriction, and gait abnormalities which are likely contributing to symptoms of pain and are negatively impacting patient ability to perform ADLs and functional mobility tasks. Tolerated flexion based exercises the best during today's session.  Patient will benefit from skilled physical therapy services to address these deficits to reduce pain, improve level of function with ADLs, functional mobility tasks, and reduce risk for falls.    Personal Factors and Comorbidities Comorbidity 1    Comorbidities chronic back pain    Examination-Activity Limitations Stand;Stairs;Squat;Locomotion Level;Lift;Bend     Examination-Participation Restrictions Meal Prep;Laundry;Community Activity;Shop;Cleaning    Stability/Clinical Decision Making Stable/Uncomplicated    Clinical Decision Making Low    Rehab Potential Fair    PT Frequency 2x / week    PT Duration 4 weeks    PT Treatment/Interventions ADLs/Self Care Home Management;Aquatic Therapy;Moist Heat;Traction;Therapeutic exercise;Therapeutic activities;Manual techniques;Electrical Stimulation;Cryotherapy;Neuromuscular re-education;Patient/family education;Dry needling;Joint Manipulations    PT Next Visit Plan piriformis stretch, likes flexion based movements, QL/ side stretches, trial traction, lumbar ROM, core strength    PT Home Exercise Plan [facility]    Consulted and Agree with Plan of Care Patient  Patient will benefit from skilled therapeutic intervention in order to improve the following deficits and impairments:  Pain, Decreased strength, Decreased activity tolerance, Difficulty walking, Decreased range of motion, Postural dysfunction  Visit Diagnosis: Chronic midline low back pain with right-sided sciatica  Difficulty in walking, not elsewhere classified  Muscle weakness (generalized)     Problem List Patient Active Problem List   Diagnosis Date Noted   Calculus of gallbladder without cholecystitis without obstruction 11/20/2020   HYPERLIPIDEMIA-MIXED 10/09/2010   OVERWEIGHT/OBESITY 10/09/2010   ABNORMAL EKG 10/09/2010   9:42 AM, 05/21/21 Tereasa Coop, DPT Physical Therapy with [facility]  [phone] office   [facility] [address] [place], [place], [address] Phone: [phone]   Fax:  [phone]  Name: Kayla Marks MRN: [id number] Date of Birth: 10-Apr-1961

## 2021-05-26 ENCOUNTER — Encounter (INDEPENDENT_AMBULATORY_CARE_PROVIDER_SITE_OTHER): Payer: 59 | Admitting: Nurse Practitioner

## 2021-05-26 ENCOUNTER — Encounter (INDEPENDENT_AMBULATORY_CARE_PROVIDER_SITE_OTHER): Payer: Self-pay | Admitting: Nurse Practitioner

## 2021-05-26 ENCOUNTER — Other Ambulatory Visit: Payer: Self-pay

## 2021-05-27 ENCOUNTER — Ambulatory Visit (HOSPITAL_COMMUNITY): Payer: 59 | Admitting: Physical Therapy

## 2021-05-27 DIAGNOSIS — M5441 Lumbago with sciatica, right side: Secondary | ICD-10-CM | POA: Diagnosis not present

## 2021-05-27 DIAGNOSIS — M6281 Muscle weakness (generalized): Secondary | ICD-10-CM

## 2021-05-27 DIAGNOSIS — R262 Difficulty in walking, not elsewhere classified: Secondary | ICD-10-CM

## 2021-05-27 DIAGNOSIS — G8929 Other chronic pain: Secondary | ICD-10-CM

## 2021-05-27 NOTE — Progress Notes (Signed)
This encounter was created in error - please disregard.

## 2021-05-27 NOTE — Patient Instructions (Signed)
Abdominal Bracing With Pelvic Floor (Hook-Lying)    Con la espalda en posicin neutral, Tensione el piso plvico y abdominales. Repita _10__ veces. Realice __2_ veces al da.  Straight Leg Raise    Doble una pierna. Levante la otra pierna.  Exhale y [employer] msculos del muslo mientras levanta la pierna. Repita con la otra pierna. Repita la serie __10__ veces. Haga __2__ sesiones por da.    Bridge    [place], piernas dobladas. Inspire presionando con las caderas [place]. Mantenga las [employer], extienda la espalda inferior. Espire rodando Intel columna desde [place]. Repita _10__ veces. Haga _2__ sesiones diarias.    Strengthening: Hip Abduction (Side-Lying)    Contraiga los msculos de la parte anterior del muslo izquierdo, eleve la pierna _15___ cm. de la superficie de apoyo, manteniendo la rodilla extendida. Repita __10__ veces por rutina.  Realice __2__ sesiones por da.

## 2021-05-27 NOTE — Therapy (Signed)
[facility] [address] [place], [place], [address] Phone: [phone]   Fax:  [phone]  Physical Therapy Treatment  Patient Details  Name: Kayla Marks MRN: [id number] Date of Birth: [birth year] Referring Provider (PT): Lisbeth Renshaw MD   Encounter Date: 05/27/2021   PT End of Session - 05/27/21 1131     Visit Number 2    Number of Visits 8    Date for PT Re-Evaluation 06/11/21    Authorization Type united healthcare, VL 24 with 0 used, no auth req.    Authorization - Visit Number 2    Authorization - Number of Visits 24    PT Start Time 1042    PT Stop Time 1125    PT Time Calculation (min) 43 min    Activity Tolerance Patient limited by pain             Past Medical History:  Diagnosis Date   Bell's palsy    history, approx 2011   Diabetes mellitus    metformin   Obesity    Palpitations    Retinopathy    Right eye symptoms    "nerve issue"   Weakness     Past Surgical History:  Procedure Laterality Date   ABDOMINAL HYSTERECTOMY  1996   CESAREAN SECTION  1990   CHOLECYSTECTOMY N/A 11/26/2020   Procedure: LAPAROSCOPIC CHOLECYSTECTOMY;  Surgeon: Lucretia Roers, MD;  Location: [facility];  Service: General;  Laterality: N/A;   GALLBLADDER SURGERY     February 2022 possibly?    There were no vitals filed for this visit.   Subjective Assessment - 05/27/21 1053     Subjective pt states her pain is much better than last visit.  Reports the exercise and her eating habbits have improved this. currently 5/10 with no radiating pain down her Rt LE unless she is standing for 10 plus minutes.  anterior distal Rt LE will also become numb    Patient is accompained by: Interpreter   [name]   Currently in Pain? Yes    Pain Score 5     Pain Location Back                               [facility] Adult PT Treatment/Exercise - 05/27/21 0001       Lumbar Exercises: Stretches   Single Knee  to Chest Stretch 10 seconds;Right;5 reps      Lumbar Exercises: Supine   Ab Set 10 reps;5 seconds    Bridge 10 reps    Straight Leg Raise 10 reps      Lumbar Exercises: Sidelying   Hip Abduction Both;10 reps                    PT Education - 05/27/21 1130     Education Details goals, HEP and POC moving forward.  Core stability and importance of strong abdominal mm, updated HEP    Person(s) Educated Patient    Methods Explanation;Demonstration;Tactile cues;Verbal cues;Handout    Comprehension Verbalized understanding;Returned demonstration;Verbal cues required;Tactile cues required;Need further instruction              PT Short Term Goals - 05/27/21 1100       PT SHORT TERM GOAL #1   Title Patient will report at least 25% improvement in overall symptoms and/or function to demonstrate improved functional mobility    Time 2  Period Weeks    Status On-going    Target Date 06/04/21      PT SHORT TERM GOAL #2   Title Patient will be independent in self management strategies to improve quality of life and functional outcomes.    Time 2    Period Weeks    Status On-going    Target Date 06/04/21      PT SHORT TERM GOAL #3   Title Patient will report being able to stand and walk around for at least 25 minutes at a time to improve ability to walk in the community    Time 2    Period Weeks    Status On-going    Target Date 06/04/21               PT Long Term Goals - 05/27/21 1059       PT LONG TERM GOAL #1   Title Patient will be able to stand and walk around for at least 45 minutes without needed to sit due to pain and weakness to improve ability to perform functional tasks.    Time 4    Period Weeks    Status On-going      PT LONG TERM GOAL #2   Title Patient will improve on FOTO score to meet predicted outcomes to demonstrate improved functional mobility.    Time 4    Period Weeks    Status On-going      PT LONG TERM GOAL #3   Title Patient  will report at least 75% improvement in overall symptoms and/or function to demonstrate improved functional mobility    Time 4    Period Weeks    Status On-going                   Plan - 05/27/21 1315     Clinical Impression Statement Interpreter present for session today.  Pt overall improved since initial visit.  Reviewed goals and POC moving forward.  Pt able to demonstrate [facility] correctly without cues. Progressed with addition of abdominal and core stabilization and added these to her HEP.  Pt without questions regarding exercises.  Pt able to complete all without pain.    Personal Factors and Comorbidities Comorbidity 1    Comorbidities chronic back pain    Examination-Activity Limitations Stand;Stairs;Squat;Locomotion Level;Lift;Bend    Examination-Participation Restrictions Meal Prep;Laundry;Community Activity;Shop;Cleaning    Stability/Clinical Decision Making Stable/Uncomplicated    Rehab Potential Fair    PT Frequency 2x / week    PT Duration 4 weeks    PT Treatment/Interventions ADLs/Self Care Home Management;Aquatic Therapy;Moist Heat;Traction;Therapeutic exercise;Therapeutic activities;Manual techniques;Electrical Stimulation;Cryotherapy;Neuromuscular re-education;Patient/family education;Dry needling;Joint Manipulations    PT Next Visit Plan piriformis stretch, likes flexion based movements, QL/ side stretches, trial traction, lumbar ROM, core strength    PT Home Exercise Plan [facility]  7/20: bridge, SLR, abdominal bracing, hip abduction sidelying    Consulted and Agree with Plan of Care Patient             Patient will benefit from skilled therapeutic intervention in order to improve the following deficits and impairments:  Pain, Decreased strength, Decreased activity tolerance, Difficulty walking, Decreased range of motion, Postural dysfunction  Visit Diagnosis: Chronic midline low back pain with right-sided sciatica  Difficulty in walking, not elsewhere  classified  Muscle weakness (generalized)     Problem List Patient Active Problem List   Diagnosis Date Noted   Calculus of gallbladder without cholecystitis without obstruction 11/20/2020   HYPERLIPIDEMIA-MIXED 10/09/2010  OVERWEIGHT/OBESITY 10/09/2010   ABNORMAL EKG 10/09/2010   Lurena Nida, PTA/CLT [phone]  Lurena Nida 05/27/2021, 1:17 PM  [facility] [address] [place], [place], [address] Phone: [phone]   Fax:  [phone]  Name: Kayla Marks MRN: [id number] Date of Birth: [birth year]

## 2021-05-28 ENCOUNTER — Encounter (INDEPENDENT_AMBULATORY_CARE_PROVIDER_SITE_OTHER): Payer: Self-pay | Admitting: Internal Medicine

## 2021-05-28 ENCOUNTER — Other Ambulatory Visit: Payer: Self-pay

## 2021-05-28 ENCOUNTER — Ambulatory Visit (INDEPENDENT_AMBULATORY_CARE_PROVIDER_SITE_OTHER): Payer: 59 | Admitting: Internal Medicine

## 2021-05-28 VITALS — BP 118/70 | HR 81 | Temp 97.3°F | Ht <= 58 in | Wt 139.6 lb

## 2021-05-28 DIAGNOSIS — E1165 Type 2 diabetes mellitus with hyperglycemia: Secondary | ICD-10-CM

## 2021-05-28 DIAGNOSIS — M546 Pain in thoracic spine: Secondary | ICD-10-CM | POA: Diagnosis not present

## 2021-05-28 DIAGNOSIS — R2681 Unsteadiness on feet: Secondary | ICD-10-CM

## 2021-05-28 DIAGNOSIS — R002 Palpitations: Secondary | ICD-10-CM | POA: Diagnosis not present

## 2021-05-28 MED ORDER — TRULICITY 0.75 MG/0.5ML ~~LOC~~ SOAJ: 0.7500 mg | SUBCUTANEOUS | 3 refills | Status: DC

## 2021-05-28 NOTE — Progress Notes (Signed)
Metrics: Intervention Frequency ACO  Documented Smoking Status Yearly  Screened one or more times in 24 months  Cessation Counseling or  Active cessation medication Past 24 months  Past 24 months   Guideline developer: UpToDate (See UpToDate for funding source) Date Released: 2014       Wellness Office Visit  Subjective:  Patient ID: Kayla Marks, female    DOB: 08/27/1961  Age: 60 y.o. MRN: [id number]  CC: This [place] lady comes in with a multitude of complaints.  She has an interpreter with her. HPI  She is a type II diabetic and her diabetes is uncontrolled.  Judson Roch started her on Trulicity but I am not sure she has been taking it.  She says that I gave her double vision. She also continues with metformin.  Her diet is not very healthy and she eats a lot of tortillas.  She does say she drinks plenty of water.  She has sugar in her coffee. She is complaining of right back pain which she has had for the last 2 weeks.  She denies any limb weakness.  The pain does not radiate anywhere else. She is also complaining of palpitations which are rapid and last for 10 to 15 minutes and these are intermittent.  They are not associated with chest pain lightheadedness or dizziness.  She is concerned about heart disease. She describes some unsteadiness of gait sometimes. Past Medical History:  Diagnosis Date   Bell's palsy    history, approx 2011   Diabetes mellitus    metformin   Obesity    Palpitations    Retinopathy    Right eye symptoms    "nerve issue"   Weakness    Past Surgical History:  Procedure Laterality Date   [place] N/A 11/26/2020   Procedure: LAPAROSCOPIC CHOLECYSTECTOMY;  Surgeon: Virl Cagey, MD;  Location: [facility];  Service: General;  Laterality: N/A;   GALLBLADDER SURGERY     February 2022 possibly?     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   Social History   Tobacco Use   Smoking status: Never   Smokeless tobacco: Never  Substance Use Topics   Alcohol use: No    Current Meds  Medication Sig   metFORMIN (GLUCOPHAGE) 1000 MG tablet Take 1 tablet (1,000 mg total) by mouth in the morning and at bedtime.   [DISCONTINUED] Dulaglutide (TRULICITY) 7.26 OM/3.5DH SOPN Inject 0.75 mg into the skin once a week.     [place] Office Visit from 04/21/2021 in [facility]  PHQ-9 Total Score 0       Objective:   Today's Vitals: BP 118/70   Pulse 81   Temp (!) 97.3 F (36.3 C) (Temporal)   Ht 4' 7" (1.397 m)   Wt 139 lb 9.6 oz (63.3 kg)   SpO2 98%   BMI 32.45 kg/m  Vitals with BMI 05/28/2021 05/26/2021 04/21/2021  Height 4' 7" 4' 7" 4' 7"  Weight 139  lbs 10 oz - 136 lbs 6 oz  BMI 40.97 - 35.3  Systolic 299 - 242  Diastolic 70 - 68  Pulse 81 - 74     Physical Exam  She remains obese.  Examination of her back shows some spasm in the right thoracic paramedian area around the region of T8/T9. Heart sounds are present and in sinus rhythm clinically.  Lung fields are clear.  There are no murmurs.  Neurological examination does not show any neurological focal signs and there would  no signs of cerebellar problems.     Assessment   1. Type 2 diabetes mellitus with hyperglycemia, without long-term current use of insulin (HCC)   2. Palpitations   3. Acute right-sided thoracic back pain   4. General unsteadiness       Tests ordered Orders Placed This Encounter  Procedures   Ambulatory referral to Cardiology      Plan: 1.  I encouraged her to take Trulicity as well as the metformin.  I suspect her diabetes is not controlled  well at all. 2.  I will refer to cardiology regarding the palpitations. 3.  I recommended ibuprofen over-the-counter for the next 3 to 5 days with food to see if it will help her muscle spasm and inflammation. 4.  I will see her in about a month's time for follow-up and we will do all the blood work then.    Meds ordered this encounter  Medications   Dulaglutide (TRULICITY) 6.83 MH/9.6QI SOPN    Sig: Inject 0.75 mg into the skin once a week.    Dispense:  2 mL    Refill:  3     Ardythe Klute Luther Parody, MD

## 2021-05-29 ENCOUNTER — Encounter (HOSPITAL_COMMUNITY): Payer: Self-pay | Admitting: Physical Therapy

## 2021-06-03 ENCOUNTER — Ambulatory Visit (HOSPITAL_COMMUNITY): Payer: 59 | Admitting: Physical Therapy

## 2021-06-03 ENCOUNTER — Other Ambulatory Visit: Payer: Self-pay

## 2021-06-03 DIAGNOSIS — M6281 Muscle weakness (generalized): Secondary | ICD-10-CM

## 2021-06-03 DIAGNOSIS — G8929 Other chronic pain: Secondary | ICD-10-CM

## 2021-06-03 DIAGNOSIS — M5441 Lumbago with sciatica, right side: Secondary | ICD-10-CM | POA: Diagnosis not present

## 2021-06-03 DIAGNOSIS — R262 Difficulty in walking, not elsewhere classified: Secondary | ICD-10-CM

## 2021-06-03 NOTE — Therapy (Signed)
[facility] [address] [place], [place], [address] Phone: [phone]   Fax:  [phone]  Physical Therapy Treatment  Patient Details  Name: Kayla Marks MRN: [id number] Date of Birth: [birth year] Referring Provider (PT): Lisbeth Renshaw MD   Encounter Date: 06/03/2021   PT End of Session - 06/03/21 0917     Visit Number 3    Number of Visits 8    Date for PT Re-Evaluation 06/11/21    Authorization Type united healthcare, VL 24 with 0 used, no auth req.    Authorization - Visit Number 3    Authorization - Number of Visits 24    PT Start Time [phone]    PT Stop Time 0913    PT Time Calculation (min) 39 min    Activity Tolerance Patient limited by pain             Past Medical History:  Diagnosis Date   Bell's palsy    history, approx 2011   Diabetes mellitus    metformin   Obesity    Palpitations    Retinopathy    Right eye symptoms    "nerve issue"   Weakness     Past Surgical History:  Procedure Laterality Date   ABDOMINAL HYSTERECTOMY  1996   CESAREAN SECTION  1990   CHOLECYSTECTOMY N/A 11/26/2020   Procedure: LAPAROSCOPIC CHOLECYSTECTOMY;  Surgeon: Lucretia Roers, MD;  Location: [facility];  Service: General;  Laterality: N/A;   GALLBLADDER SURGERY     February 2022 possibly?    There were no vitals filed for this visit.   Subjective Assessment - 06/03/21 0848     Subjective Pt states she continues to improve.  Currently without pain or Rt LE radiating pain.  Overall 3/10, very light.  Interpreter present today    Patient is accompained by: Interpreter   Lillia Mountain   Currently in Pain? Yes    Pain Score 3     Pain Location Back    Pain Orientation Right;Lower    Pain Descriptors / Indicators Aching;Tightness                               [facility] Adult PT Treatment/Exercise - 06/03/21 0001       Lumbar Exercises: Stretches   Active Hamstring Stretch Right;Left;3 reps;20 seconds     Active Hamstring Stretch Limitations with towel    Single Knee to Chest Stretch 10 seconds;Right;5 reps    Piriformis Stretch Right;Left;3 reps;20 seconds    Piriformis Stretch Limitations in supine (unable in sitting)      Lumbar Exercises: Supine   Ab Set 10 reps;5 seconds    Bridge 10 reps    Straight Leg Raise 10 reps      Lumbar Exercises: Sidelying   Hip Abduction Both;10 reps                      PT Short Term Goals - 05/27/21 1100       PT SHORT TERM GOAL #1   Title Patient will report at least 25% improvement in overall symptoms and/or function to demonstrate improved functional mobility    Time 2    Period Weeks    Status On-going    Target Date 06/04/21      PT SHORT TERM GOAL #2   Title Patient will be independent in self management strategies to improve quality of  life and functional outcomes.    Time 2    Period Weeks    Status On-going    Target Date 06/04/21      PT SHORT TERM GOAL #3   Title Patient will report being able to stand and walk around for at least 25 minutes at a time to improve ability to walk in the community    Time 2    Period Weeks    Status On-going    Target Date 06/04/21               PT Long Term Goals - 05/27/21 1059       PT LONG TERM GOAL #1   Title Patient will be able to stand and walk around for at least 45 minutes without needed to sit due to pain and weakness to improve ability to perform functional tasks.    Time 4    Period Weeks    Status On-going      PT LONG TERM GOAL #2   Title Patient will improve on FOTO score to meet predicted outcomes to demonstrate improved functional mobility.    Time 4    Period Weeks    Status On-going      PT LONG TERM GOAL #3   Title Patient will report at least 75% improvement in overall symptoms and/or function to demonstrate improved functional mobility    Time 4    Period Weeks    Status On-going                   Plan - 06/03/21 1042      Clinical Impression Statement interpreter present for session today.  Pt continues to improve and very pleased with progress.  Progressed today with addition of lumbar ROM exercises and prone hip extension for glute strength.  Pt with difficulty performing piriformis stretch with Lt LE due to tightness but much looser with Rt and better results completing in supine position.  Pt requires cues for count and hold times of exercises.    Personal Factors and Comorbidities Comorbidity 1    Comorbidities chronic back pain    Examination-Activity Limitations Stand;Stairs;Squat;Locomotion Level;Lift;Bend    Examination-Participation Restrictions Meal Prep;Laundry;Community Activity;Shop;Cleaning    Stability/Clinical Decision Making Stable/Uncomplicated    Rehab Potential Fair    PT Frequency 2x / week    PT Duration 4 weeks    PT Treatment/Interventions ADLs/Self Care Home Management;Aquatic Therapy;Moist Heat;Traction;Therapeutic exercise;Therapeutic activities;Manual techniques;Electrical Stimulation;Cryotherapy;Neuromuscular re-education;Patient/family education;Dry needling;Joint Manipulations    PT Next Visit Plan continue to progress core strength and stability.    PT Home Exercise Plan [facility]  7/20: bridge, SLR, abdominal bracing, hip abduction sidelying    Consulted and Agree with Plan of Care Patient             Patient will benefit from skilled therapeutic intervention in order to improve the following deficits and impairments:  Pain, Decreased strength, Decreased activity tolerance, Difficulty walking, Decreased range of motion, Postural dysfunction  Visit Diagnosis: Chronic midline low back pain with right-sided sciatica  Difficulty in walking, not elsewhere classified  Muscle weakness (generalized)     Problem List Patient Active Problem List   Diagnosis Date Noted   Calculus of gallbladder without cholecystitis without obstruction 11/20/2020   HYPERLIPIDEMIA-MIXED 10/09/2010    OVERWEIGHT/OBESITY 10/09/2010   ABNORMAL EKG 10/09/2010   Lurena Nida, PTA/CLT [phone]  Emeline Gins B 06/03/2021, 10:44 AM  [facility] [address]  [place], [place], [address] Phone: [phone]   Fax:  [phone]  Name: Kayla Marks MRN: [id number] Date of Birth: December 15, 1960

## 2021-06-10 ENCOUNTER — Encounter (HOSPITAL_COMMUNITY): Payer: Self-pay | Admitting: Physical Therapy

## 2021-06-10 ENCOUNTER — Ambulatory Visit (HOSPITAL_COMMUNITY): Payer: 59 | Attending: Neurosurgery | Admitting: Physical Therapy

## 2021-06-10 ENCOUNTER — Other Ambulatory Visit: Payer: Self-pay

## 2021-06-10 DIAGNOSIS — M6281 Muscle weakness (generalized): Secondary | ICD-10-CM | POA: Insufficient documentation

## 2021-06-10 DIAGNOSIS — R262 Difficulty in walking, not elsewhere classified: Secondary | ICD-10-CM

## 2021-06-10 DIAGNOSIS — G8929 Other chronic pain: Secondary | ICD-10-CM | POA: Insufficient documentation

## 2021-06-10 DIAGNOSIS — M5441 Lumbago with sciatica, right side: Secondary | ICD-10-CM | POA: Diagnosis not present

## 2021-06-10 NOTE — Therapy (Signed)
[facility] [address] [place], [place], [address] Phone: [phone]   Fax:  [phone]  Physical Therapy Treatment and Discharge note  Patient Details  Name: Kayla Marks MRN: [id number] Date of Birth: 07-13-1961 Referring Provider (PT): Consuella Lose MD   PHYSICAL THERAPY DISCHARGE SUMMARY  Visits from Start of Care: 4  Current functional level related to goals / functional outcomes: All goals met   Remaining deficits: See below   Education / Equipment: See below  Patient agrees to discharge. Patient goals were met. Patient is being discharged due to meeting the stated rehab goals.   Encounter Date: 06/10/2021   PT End of Session - 06/10/21 1324     Visit Number 4    Number of Visits 8    Date for PT Re-Evaluation 06/11/21    Authorization Type united healthcare, VL 24 with 0 used, no auth req.    Authorization - Visit Number 4    Authorization - Number of Visits 24    PT Start Time [phone]   late to checkin   PT Stop Time 1005    PT Time Calculation (min) 40 min    Activity Tolerance Patient limited by pain             Past Medical History:  Diagnosis Date   Bell's palsy    history, approx 2011   Diabetes mellitus    metformin   Obesity    Palpitations    Retinopathy    Right eye symptoms    "nerve issue"   Weakness     Past Surgical History:  Procedure Laterality Date   [place] N/A 11/26/2020   Procedure: LAPAROSCOPIC CHOLECYSTECTOMY;  Surgeon: Virl Cagey, MD;  Location: [facility];  Service: General;  Laterality: N/A;   GALLBLADDER SURGERY     February 2022 possibly?    There were no vitals filed for this visit.       [facility] PT Assessment - 06/10/21 0001       Assessment   Medical Diagnosis LBP    Referring Provider (PT) Consuella Lose MD      Observation/Other Assessments   Focus on Therapeutic Outcomes (FOTO)   57% function   predicted 54% function     AROM   Lumbar Flexion 0% limited   no pain   Lumbar Extension 25% limited   no pain   Lumbar - Right Side Bend 25% limited   no pain   Lumbar - Left Side Bend 25% limited   no pain     Strength   Right Hip Flexion 4+/5    Right Hip Extension 4/5   no pain in back   Left Hip Flexion 4/5    Left Hip Extension 4+/5   no pain   Right Knee Flexion 4+/5    Right Knee Extension 5/5    Left Knee Flexion 4+/5    Left Knee Extension 5/5    Right Ankle Dorsiflexion 5/5    Left Ankle Dorsiflexion 5/5                           [facility] Adult PT Treatment/Exercise - 06/10/21 0001       Lumbar Exercises: Supine   Other Supine Lumbar Exercises TRA activation 15 minutes  PT Education - 06/10/21 1019     Education Details on FOTO score, on HEP, on Presentation.    Person(s) Educated Patient    Methods Explanation    Comprehension Verbalized understanding              PT Short Term Goals - 06/10/21 0927       PT SHORT TERM GOAL #1   Title Patient will report at least 25% improvement in overall symptoms and/or function to demonstrate improved functional mobility    Baseline 95% better    Time 2    Period Weeks    Status Achieved    Target Date 06/04/21      PT SHORT TERM GOAL #2   Title Patient will be independent in self management strategies to improve quality of life and functional outcomes.    Baseline performs daily    Time 2    Period Weeks    Status Achieved    Target Date 06/04/21      PT SHORT TERM GOAL #3   Title Patient will report being able to stand and walk around for at least 25 minutes at a time to improve ability to walk in the community    Baseline more than an hour    Time 2    Period Weeks    Status Achieved    Target Date 06/04/21               PT Long Term Goals - 06/10/21 0928       PT LONG TERM GOAL #1   Title Patient will be able to stand and walk  around for at least 45 minutes without needed to sit due to pain and weakness to improve ability to perform functional tasks.    Time 4    Period Weeks    Status Achieved      PT LONG TERM GOAL #2   Title Patient will improve on FOTO score to meet predicted outcomes to demonstrate improved functional mobility.    Time 4    Period Weeks    Status Achieved      PT LONG TERM GOAL #3   Title Patient will report at least 75% improvement in overall symptoms and/or function to demonstrate improved functional mobility    Time 4    Period Weeks    Status Achieved                   Plan - 06/10/21 1022     Clinical Impression Statement All goals met at this time. Reviewed HEP and updated TRA activation secondary to difficulties with initial exercise. No pain noted end of session. Patient to discharge from PT to HEP secondary to progress made and independence in HEP.    Personal Factors and Comorbidities Comorbidity 1    Comorbidities chronic back pain    Examination-Activity Limitations Stand;Stairs;Squat;Locomotion Level;Lift;Bend    Examination-Participation Restrictions Meal Prep;Laundry;Community Activity;Shop;Cleaning    Stability/Clinical Decision Making Stable/Uncomplicated    Rehab Potential Fair    PT Frequency 2x / week    PT Duration 4 weeks    PT Treatment/Interventions ADLs/Self Care Home Management;Aquatic Therapy;Moist Heat;Traction;Therapeutic exercise;Therapeutic activities;Manual techniques;Electrical Stimulation;Cryotherapy;Neuromuscular re-education;Patient/family education;Dry needling;Joint Manipulations    PT Next Visit Plan DC to HEP    PT Home Exercise Plan [facility]  7/20: bridge, SLR, abdominal bracing, hip abduction sidelying    Consulted and Agree with Plan of Care Patient  Patient will benefit from skilled therapeutic intervention in order to improve the following deficits and impairments:  Pain, Decreased strength, Decreased activity  tolerance, Difficulty walking, Decreased range of motion, Postural dysfunction  Visit Diagnosis: Chronic midline low back pain with right-sided sciatica  Muscle weakness (generalized)  Difficulty in walking, not elsewhere classified     Problem List Patient Active Problem List   Diagnosis Date Noted   Calculus of gallbladder without cholecystitis without obstruction 11/20/2020   HYPERLIPIDEMIA-MIXED 10/09/2010   OVERWEIGHT/OBESITY 10/09/2010   ABNORMAL EKG 10/09/2010    10:22 AM, 06/10/21 Jerene Pitch, DPT Physical Therapy with [facility]  [phone] office   [place], [place], [address] Phone: [phone]   Fax:  [phone]  Name: BRINDLEY MADARANG MRN: [id number] Date of Birth: May 27, 1961

## 2021-06-12 ENCOUNTER — Encounter (HOSPITAL_COMMUNITY): Payer: 59

## 2021-06-17 ENCOUNTER — Encounter (HOSPITAL_COMMUNITY): Payer: 59 | Admitting: Physical Therapy

## 2021-06-19 ENCOUNTER — Encounter (HOSPITAL_COMMUNITY): Payer: 59 | Admitting: Physical Therapy

## 2021-07-01 ENCOUNTER — Other Ambulatory Visit: Payer: Self-pay

## 2021-07-01 ENCOUNTER — Ambulatory Visit (INDEPENDENT_AMBULATORY_CARE_PROVIDER_SITE_OTHER): Payer: 59 | Admitting: Internal Medicine

## 2021-07-01 ENCOUNTER — Encounter: Payer: Self-pay | Admitting: Internal Medicine

## 2021-07-01 VITALS — BP 126/76 | HR 74 | Ht <= 58 in | Wt 137.2 lb

## 2021-07-01 DIAGNOSIS — E663 Overweight: Secondary | ICD-10-CM

## 2021-07-01 DIAGNOSIS — E1165 Type 2 diabetes mellitus with hyperglycemia: Secondary | ICD-10-CM | POA: Diagnosis not present

## 2021-07-01 DIAGNOSIS — M48061 Spinal stenosis, lumbar region without neurogenic claudication: Secondary | ICD-10-CM | POA: Diagnosis not present

## 2021-07-01 DIAGNOSIS — R2681 Unsteadiness on feet: Secondary | ICD-10-CM | POA: Diagnosis not present

## 2021-07-01 DIAGNOSIS — E119 Type 2 diabetes mellitus without complications: Secondary | ICD-10-CM | POA: Insufficient documentation

## 2021-07-01 DIAGNOSIS — R002 Palpitations: Secondary | ICD-10-CM | POA: Diagnosis not present

## 2021-07-01 DIAGNOSIS — R9431 Abnormal electrocardiogram [ECG] [EKG]: Secondary | ICD-10-CM

## 2021-07-01 LAB — GLUCOSE, POCT (MANUAL RESULT ENTRY): POC Glucose: 187 mg/dl — AB (ref 70–99)

## 2021-07-01 MED ORDER — GABAPENTIN 100 MG PO CAPS: 100.0000 mg | ORAL_CAPSULE | Freq: Three times a day (TID) | ORAL | 3 refills | Status: AC

## 2021-07-01 MED ORDER — TRULICITY 1.5 MG/0.5ML ~~LOC~~ SOAJ: 1.5000 mg | SUBCUTANEOUS | 4 refills | Status: AC

## 2021-07-01 NOTE — Assessment & Plan Note (Addendum)
On metformin, I will increase the dose of Trulicity

## 2021-07-01 NOTE — Progress Notes (Signed)
New Patient Office Visit  Subjective:  Patient ID: Kayla Marks, female    DOB: 1961-03-18  Age: 60 y.o. MRN: [id number]  CC:  Chief Complaint  Patient presents with   New Patient (Initial Visit)    HPI Patient presents for new patient visit, Patient is known to have dyslipidemia- I encouraged the patient to lose weight.  - I educated them on making healthy dietary choices including eating more fruits and vegetables and less fried foods.  She also has a history of right-sided back pain she has intermittent dizziness has been treated with gabapentin, metformin and  She does not smoke does not drink  Past Medical History:  Diagnosis Date   Bell's palsy    history, approx 2011   Diabetes mellitus    metformin   Obesity    Palpitations    Retinopathy    Right eye symptoms    "nerve issue"   Weakness      Current Outpatient Medications:    Dulaglutide (TRULICITY) 1.5 HQ/4.6NG SOPN, Inject 1.5 mg into the skin once a week., Disp: 30 mL, Rfl: 4   gabapentin (NEURONTIN) 100 MG capsule, Take 1 capsule (100 mg total) by mouth 3 (three) times daily., Disp: 90 capsule, Rfl: 3   metFORMIN (GLUCOPHAGE) 1000 MG tablet, Take 1 tablet (1,000 mg total) by mouth in the morning and at bedtime., Disp: 180 tablet, Rfl: 0   Past Surgical History:  Procedure Laterality Date   [place] N/A 11/26/2020   Procedure: LAPAROSCOPIC CHOLECYSTECTOMY;  Surgeon: Virl Cagey, MD;  Location: [facility];  Service: General;  Laterality: N/A;   GALLBLADDER SURGERY     February 2022 possibly?    Family History  Problem Relation Age of Onset   Other Father        complications of CVA   Diabetes Brother    Other Sister        multiple myeloma    Social History   Socioeconomic History   Marital status: Married    Spouse name: Roosevelt   Number of children: Not on file   Years of education: 12   Highest education level: Not on file   Occupational History   Not on file  Tobacco Use   Smoking status: Never   Smokeless tobacco: Never  Vaping Use   Vaping Use: Never used  Substance and Sexual Activity   Alcohol use: No   Drug use: No   Sexual activity: Yes    Birth control/protection: Surgical  Other Topics Concern   Not on file  Social History Narrative   ** Merged History Encounter **       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   Social Determinants of Health   Financial Resource Strain: Not on file  Food Insecurity: Not on file  Transportation Needs: Not on file  Physical Activity: Not on file  Stress: Not on file  Social Connections: Not on file  Intimate Partner Violence: Not on file    ROS Review of Systems  Constitutional: Negative.   HENT: Negative.    Eyes: Negative.   Respiratory: Negative.    Cardiovascular:  Negative for chest pain, palpitations and leg swelling.  Gastrointestinal:  Positive for abdominal pain.  Endocrine: Negative.  Negative for polydipsia and polyphagia.  Genitourinary: Negative.  Negative for flank pain.  Musculoskeletal:  Positive for myalgias.  Skin: Negative.  Negative for pallor.  Allergic/Immunologic: Negative.   Neurological: Negative.  Negative for seizures, speech difficulty and headaches.  Hematological: Negative.   Psychiatric/Behavioral: Negative.  Negative for behavioral problems.   All other systems reviewed and are negative. Patient has a history of Bell's palsy diabetes mellitus obesity palpitation retinopathy right eye symptoms are due to nerve issue Objective:   Today's Vitals: BP 126/76   Pulse 74   Ht '4\' 7"'  (1.397 m)   Wt 137 lb 3.2 oz (62.2 kg)   BMI 31.89 kg/m   Physical Exam Constitutional:      Appearance: Normal appearance. She is obese.  HENT:     Head: Normocephalic.     Nose: Nose normal.     Mouth/Throat:     Mouth: Mucous membranes  are moist.  Eyes:     Extraocular Movements: Extraocular movements intact.     Conjunctiva/sclera: Conjunctivae normal.     Pupils: Pupils are equal, round, and reactive to light.  Cardiovascular:     Rate and Rhythm: Normal rate and regular rhythm.     Pulses: Normal pulses.     Heart sounds: Normal heart sounds.    No friction rub.  Pulmonary:     Effort: Pulmonary effort is normal.     Breath sounds: Normal breath sounds.  Abdominal:     General: There is distension.     Palpations: Abdomen is soft. There is no mass.     Tenderness: There is no abdominal tenderness. There is no guarding or rebound.  Musculoskeletal:        General: No swelling or deformity.     Cervical back: Normal range of motion.     Right lower leg: No edema.     Left lower leg: No edema.     Comments: Complain of pain in the both thigh, no swelling no tenderness in the calf peripheral pulses are palpable  Neurological:     General: No focal deficit present.     Mental Status: She is alert and oriented to person, place, and time. Mental status is at baseline.  Psychiatric:        Mood and Affect: Mood normal.    Assessment & Plan:   Problem List Items Addressed This Visit       Endocrine   Diabetes mellitus without complication ([place])    On metformin, I will increase the dose of Trulicity        Relevant Medications   Dulaglutide (TRULICITY) 1.5 UU/7.2ZD SOPN   Type 2 diabetes mellitus with hyperglycemia, without long-term current use of insulin (HCC) - Primary   Relevant Medications   Dulaglutide (TRULICITY) 1.5 GU/4.4IH SOPN   Other Relevant Orders   POCT glucose (manual entry) (Completed)     Other   Overweight    - I encouraged the patient to lose weight.  - I educated them on making healthy dietary choices including eating more fruits and vegetables and less fried foods. - I encouraged the patient to exercise more, and educated on the benefits of exercise including weight loss, diabetes  prevention, and hypertension prevention.   Dietary counseling with a registered dietician  Referral to a weight management support group (e.g. Weight Watchers, Overeaters Anonymous)  If your BMI is greater than 29 or you have gained more than 15 pounds you should work on weight loss.  Attend a healthy cooking class       ABNORMAL EKG  Spinal stenosis of lumbar region    Pain  Both thighs, c/o burning  Lt and rt side of thigh,.  Due to spinal stenosis      Relevant Medications   gabapentin (NEURONTIN) 100 MG capsule   General unsteadiness    Due to back pain, patient has undergone full treatment with physical therapy      Palpitations    No syncope , no h/o ht attack, does not smoke  Or drink alcohol       Outpatient Encounter Medications as of 07/01/2021  Medication Sig   Dulaglutide (TRULICITY) 1.5 JE/5.6DJ SOPN Inject 1.5 mg into the skin once a week.   gabapentin (NEURONTIN) 100 MG capsule Take 1 capsule (100 mg total) by mouth 3 (three) times daily.   metFORMIN (GLUCOPHAGE) 1000 MG tablet Take 1 tablet (1,000 mg total) by mouth in the morning and at bedtime.   [DISCONTINUED] Dulaglutide (TRULICITY) 4.97 WY/6.3ZC SOPN Inject 0.75 mg into the skin once a week.   [DISCONTINUED] famotidine (PEPCID) 20 MG tablet Take 1 tablet (20 mg total) by mouth 2 (two) times daily. (Patient not taking: No sig reported)   [DISCONTINUED] gabapentin (NEURONTIN) 100 MG capsule Take 100 mg by mouth 2 (two) times daily. (Patient not taking: No sig reported)   No facility-administered encounter medications on file as of 07/01/2021.    Follow-up: No follow-ups on file.   Cletis Athens, MD

## 2021-07-01 NOTE — Assessment & Plan Note (Signed)
No syncope , no h/o ht attack, does not smoke  Or drink alcohol

## 2021-07-01 NOTE — Assessment & Plan Note (Signed)
Hypercholesterolemia  I advised the patient to follow Mediterranean diet This diet is rich in fruits vegetables and whole grain, and This diet is also rich in fish and lean meat Patient should also eat a handful of almonds or walnuts daily Recent heart study indicated that average follow-up on this kind of diet reduces the cardiovascular mortality by 50 to 70%== 

## 2021-07-01 NOTE — Assessment & Plan Note (Addendum)
Pain  Both thighs, c/o burning  Lt and rt side of thigh,.  Due to spinal stenosis

## 2021-07-01 NOTE — Assessment & Plan Note (Signed)
Due to back pain, patient has undergone full treatment with physical therapy

## 2021-07-01 NOTE — Assessment & Plan Note (Signed)

## 2021-07-06 ENCOUNTER — Other Ambulatory Visit (INDEPENDENT_AMBULATORY_CARE_PROVIDER_SITE_OTHER): Payer: 59

## 2021-07-06 DIAGNOSIS — R002 Palpitations: Secondary | ICD-10-CM

## 2021-07-06 DIAGNOSIS — E1165 Type 2 diabetes mellitus with hyperglycemia: Secondary | ICD-10-CM | POA: Diagnosis not present

## 2021-07-06 DIAGNOSIS — E663 Overweight: Secondary | ICD-10-CM

## 2021-07-06 DIAGNOSIS — E785 Hyperlipidemia, unspecified: Secondary | ICD-10-CM

## 2021-07-08 LAB — COMPLETE METABOLIC PANEL WITH GFR
AG Ratio: 1.5 (calc) (ref 1.0–2.5)
ALT: 20 U/L (ref 6–29)
AST: 16 U/L (ref 10–35)
Albumin: 4.3 g/dL (ref 3.6–5.1)
Alkaline phosphatase (APISO): 70 U/L (ref 37–153)
BUN: 10 mg/dL (ref 7–25)
CO2: 24 mmol/L (ref 20–32)
Calcium: 9.6 mg/dL (ref 8.6–10.4)
Chloride: 103 mmol/L (ref 98–110)
Creat: 0.55 mg/dL (ref 0.50–1.03)
Globulin: 2.8 g/dL (calc) (ref 1.9–3.7)
Glucose, Bld: 225 mg/dL — ABNORMAL HIGH (ref 65–99)
Potassium: 4.1 mmol/L (ref 3.5–5.3)
Sodium: 139 mmol/L (ref 135–146)
Total Bilirubin: 0.7 mg/dL (ref 0.2–1.2)
Total Protein: 7.1 g/dL (ref 6.1–8.1)
eGFR: 106 mL/min/{1.73_m2} (ref >=60)

## 2021-07-08 LAB — CBC WITH DIFFERENTIAL/PLATELET
Absolute Monocytes: 384 cells/uL (ref 200–950)
Basophils Absolute: 33 cells/uL (ref 0–200)
Basophils Relative: 0.5 %
Eosinophils Absolute: 59 cells/uL (ref 15–500)
Eosinophils Relative: 0.9 %
HCT: 47.8 % — ABNORMAL HIGH (ref 35.0–45.0)
Hemoglobin: 15.5 g/dL (ref 11.7–15.5)
Lymphs Abs: 2594 cells/uL (ref 850–3900)
MCH: 30.8 pg (ref 27.0–33.0)
MCHC: 32.4 g/dL (ref 32.0–36.0)
MCV: 94.8 fL (ref 80.0–100.0)
MPV: 10.1 fL (ref 7.5–12.5)
Monocytes Relative: 5.9 %
Neutro Abs: 3432 cells/uL (ref 1500–7800)
Neutrophils Relative %: 52.8 %
Platelets: 312 10*3/uL (ref 140–400)
RBC: 5.04 10*6/uL (ref 3.80–5.10)
RDW: 11.6 % (ref 11.0–15.0)
Total Lymphocyte: 39.9 %
WBC: 6.5 10*3/uL (ref 3.8–10.8)

## 2021-07-08 LAB — LIPID PANEL
Cholesterol: 198 mg/dL (ref <200)
HDL: 55 mg/dL (ref >=50)
LDL Cholesterol (Calc): 106 mg/dL (calc) — ABNORMAL HIGH
Non-HDL Cholesterol (Calc): 143 mg/dL (calc) — ABNORMAL HIGH (ref <130)
Total CHOL/HDL Ratio: 3.6 (calc) (ref <5.0)
Triglycerides: 258 mg/dL — ABNORMAL HIGH (ref <150)

## 2021-07-08 LAB — TSH: TSH: 2.18 mIU/L (ref 0.40–4.50)

## 2021-07-08 LAB — HEMOGLOBIN A1C
Hgb A1c MFr Bld: 10.4 % of total Hgb — ABNORMAL HIGH (ref <5.7)
Mean Plasma Glucose: 252 mg/dL
eAG (mmol/L): 13.9 mmol/L

## 2021-07-09 ENCOUNTER — Ambulatory Visit (INDEPENDENT_AMBULATORY_CARE_PROVIDER_SITE_OTHER): Payer: 59 | Admitting: Internal Medicine

## 2021-08-19 ENCOUNTER — Other Ambulatory Visit: Payer: Self-pay | Admitting: *Deleted

## 2021-08-19 MED ORDER — METFORMIN HCL 1000 MG PO TABS: 1000.0000 mg | ORAL_TABLET | Freq: Two times a day (BID) | ORAL | 2 refills | Status: AC

## 2021-09-07 ENCOUNTER — Ambulatory Visit (INDEPENDENT_AMBULATORY_CARE_PROVIDER_SITE_OTHER): Payer: 59 | Admitting: Cardiology

## 2021-09-07 ENCOUNTER — Encounter: Payer: Self-pay | Admitting: Cardiology

## 2021-09-07 ENCOUNTER — Other Ambulatory Visit: Payer: Self-pay

## 2021-09-07 ENCOUNTER — Ambulatory Visit (INDEPENDENT_AMBULATORY_CARE_PROVIDER_SITE_OTHER): Payer: 59

## 2021-09-07 VITALS — BP 130/78 | HR 73 | Ht <= 58 in | Wt 140.0 lb

## 2021-09-07 DIAGNOSIS — Z8249 Family history of ischemic heart disease and other diseases of the circulatory system: Secondary | ICD-10-CM | POA: Diagnosis not present

## 2021-09-07 DIAGNOSIS — R002 Palpitations: Secondary | ICD-10-CM

## 2021-09-07 DIAGNOSIS — E119 Type 2 diabetes mellitus without complications: Secondary | ICD-10-CM | POA: Diagnosis not present

## 2021-09-07 NOTE — Progress Notes (Signed)
Cardiology Office Note:    Date:  09/07/2021   ID:  Kayla Marks, DOB [birth year], MRN [id number]  PCP:  Corky Downs, MD   [facility] Providers Cardiologist:  None     Referring MD: Wilson Singer, MD    History of Present Illness:    Kayla Marks is a 60 y.o. female here for the evaluation of palpitations at the request of Dr. Juel Burrow.  Has type 2 diabetes.  Felt palpitations, rapid heartbeats lasting about 10 to 15 minutes in duration intermittently. Did drink coffee at late evening. Never had heart issues. Sleeping at night, sat at edge of bed, heart was beating rapid and then went through body. Jittery. Deep breath tried to relieve it.  Did not seem to be associated with any chest discomfort dizziness syncope.  She was concerned about overall heart disease. No chest pain with stairs or hills. Can happen all of a sudden when walking.   2 brothers had heart issues- had stents.   No syncope no bleeding no fevers no chills  Past Medical History:  Diagnosis Date   Bell's palsy    history, approx 2011   Diabetes mellitus    metformin   Obesity    Palpitations    Retinopathy    Right eye symptoms    "nerve issue"   Weakness     Past Surgical History:  Procedure Laterality Date   ABDOMINAL HYSTERECTOMY  1996   CESAREAN SECTION  1990   CHOLECYSTECTOMY N/A 11/26/2020   Procedure: LAPAROSCOPIC CHOLECYSTECTOMY;  Surgeon: Lucretia Roers, MD;  Location: [facility];  Service: General;  Laterality: N/A;   GALLBLADDER SURGERY     February 2022 possibly?    Current Medications: Current Meds  Medication Sig   Dulaglutide (TRULICITY) 1.5 MG/0.5ML SOPN Inject 1.5 mg into the skin once a week.   gabapentin (NEURONTIN) 100 MG capsule Take 1 capsule (100 mg total) by mouth 3 (three) times daily.   metFORMIN (GLUCOPHAGE) 1000 MG tablet Take 1 tablet (1,000 mg total) by mouth in the morning and at bedtime.     Allergies:   Insulins, Penicillin g, and Penicillins   Social  History   Socioeconomic History   Marital status: Married    Spouse name: Salvador   Number of children: Not on file   Years of education: 12   Highest education level: Not on file  Occupational History   Not on file  Tobacco Use   Smoking status: Never   Smokeless tobacco: Never  Vaping Use   Vaping Use: Never used  Substance and Sexual Activity   Alcohol use: No   Drug use: No   Sexual activity: Yes    Birth control/protection: Surgical  Other Topics Concern   Not on file  Social History Narrative   ** Merged History Encounter **       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   Social Determinants of Health   Financial Resource Strain: Not on file  Food Insecurity: Not on file  Transportation Needs: Not on file  Physical Activity: Not on file  Stress: Not on file  Social Connections: Not on file     Family History: The patient's family history includes Coronary artery disease in her brother; Diabetes in her brother; Other in her father and sister.  ROS:   Please see the history of present illness.  All other systems reviewed and are negative.  EKGs/Labs/Other Studies Reviewed:    The following studies were reviewed today: EKG reviewed prior notes.  EKG:  EKG is  ordered today.  The ekg ordered today demonstrates SR 70 right axis  Recent Labs: 07/06/2021: ALT 20; BUN 10; Creat 0.55; Hemoglobin 15.5; Platelets 312; Potassium 4.1; Sodium 139; TSH 2.18  Recent Lipid Panel    Component Value Date/Time   CHOL 198 07/06/2021 0914   TRIG 258 (H) 07/06/2021 0914   HDL 55 07/06/2021 0914   CHOLHDL 3.6 07/06/2021 0914   LDLCALC 106 (H) 07/06/2021 0914         Physical Exam:    VS:  BP 130/78 (BP Location: Right Arm)   Pulse 73   Ht 4\' 7"  (1.397 m)   Wt 140 lb (63.5 kg)   SpO2 97%   BMI 32.54 kg/m     Wt Readings from Last 3 Encounters:  09/07/21 140 lb (63.5  kg)  07/01/21 137 lb 3.2 oz (62.2 kg)  05/28/21 139 lb 9.6 oz (63.3 kg)     GEN:  Well nourished, well developed in no acute distress HEENT: Normal NECK: No JVD; No carotid bruits LYMPHATICS: No lymphadenopathy CARDIAC: RRR, no murmurs, rubs, gallops RESPIRATORY:  Clear to auscultation without rales, wheezing or rhonchi  ABDOMEN: Soft, non-tender, non-distended MUSCULOSKELETAL:  No edema; No deformity  SKIN: Warm and dry NEUROLOGIC:  Alert and oriented x 3 PSYCHIATRIC:  Normal affect   ASSESSMENT:    1. Palpitations   2. Family history of heart disease in brother   3. Diabetes mellitus without complication (HCC)    PLAN:    In order of problems listed above:  Palpitations We will go ahead and place a Zio patch monitor.  This will be helpful on identifying what her rapid heart rates are.  Also we want to ensure that she is not having any adverse arrhythmias.  We will see her back in 6 months to ensure that she does not have worsening symptoms.    Family history of heart disease in brother She stated that 2 of her brothers have had heart "blockages that have been opened up ".  Likely stent placement.  Given her diabetes, we will keep an eye on any possible chest discomfort that seems to be worsening.    Diabetes mellitus without complication (HCC) Given her diabetes, we would recommend statin therapy for further prevention.  If she is not placed on statin by the next visit, we will go ahead and initiate then.     6 months with APP   Medication Adjustments/Labs and Tests Ordered: Current medicines are reviewed at length with the patient today.  Concerns regarding medicines are outlined above.  Orders Placed This Encounter  Procedures   LONG TERM MONITOR (3-14 DAYS)   EKG 12-Lead   No orders of the defined types were placed in this encounter.   Patient Instructions  Medication Instructions:  The current medical regimen is effective;  continue present plan and  medications.  *If you need a refill on your cardiac medications before your next appointment, please call your pharmacy*  Testing/Procedures: ZIO XT- Long Term Monitor Instructions  Your physician has requested you wear a ZIO patch monitor for 14 days.  This is a single patch monitor. [employer] supplies one patch monitor per enrollment. Additional stickers are not available. Please do not apply patch if you will be having a Nuclear Stress Test,  Echocardiogram, Cardiac CT, MRI, or  Chest Xray during the period you would be wearing the  monitor. The patch cannot be worn during these tests. You cannot remove and re-apply the  ZIO XT patch monitor.  Your ZIO patch monitor will be mailed 3 day USPS to your address on file. It may take 3-5 days  to receive your monitor after you have been enrolled.  Once you have received your monitor, please review the enclosed instructions. Your monitor  has already been registered assigning a specific monitor serial # to you.  Billing and Patient Assistance Program Information  We have supplied [employer] with any of your insurance information on file for billing purposes. [employer] offers a sliding scale Patient Assistance Program for patients that do not have  insurance, or whose insurance does not completely cover the cost of the ZIO monitor.  You must apply for the Patient Assistance Program to qualify for this discounted rate.  To apply, please call [employer] at [phone], select option 4, select option 2, ask to apply for  Patient Assistance Program. Meredeth Ide will ask your household income, and how many people  are in your household. They will quote your out-of-pocket cost based on that information.  [employer] will also be able to set up a 25-month, interest-free payment plan if needed.  Applying the monitor   Shave hair from upper left chest.  Hold abrader disc by orange tab. Rub abrader in 40 strokes over the upper left chest as  indicated in your  monitor instructions.  Clean area with 4 enclosed alcohol pads. Let dry.  Apply patch as indicated in monitor instructions. Patch will be placed under collarbone on left  side of chest with arrow pointing upward.  Rub patch adhesive wings for 2 minutes. Remove white label marked "1". Remove the white  label marked "2". Rub patch adhesive wings for 2 additional minutes.  While looking in a mirror, press and release button in center of patch. A small green light will  flash 3-4 times. This will be your only indicator that the monitor has been turned on.  Do not shower for the first 24 hours. You may shower after the first 24 hours.  Press the button if you feel a symptom. You will hear a small click. Record Date, Time and  Symptom in the Patient Logbook.  When you are ready to remove the patch, follow instructions on the last 2 pages of Patient  Logbook. Stick patch monitor onto the last page of Patient Logbook.  Place Patient Logbook in the blue and white box. Use locking tab on box and tape box closed  securely. The blue and white box has prepaid postage on it. Please place it in the mailbox as  soon as possible. Your physician should have your test results approximately 7 days after the  monitor has been mailed back to [facility].  Call [facility] Customer Care at [phone] if you have questions regarding  your ZIO XT patch monitor. Call them immediately if you see an orange light blinking on your  monitor.  If your monitor falls off in less than 4 days, contact our Monitor department at [phone].  If your monitor becomes loose or falls off after 4 days call [employer] at [phone] for  suggestions on securing your monitor    Follow-Up: At [facility], you and your health needs are our priority.  As part of our continuing mission to provide you with exceptional heart care, we have created designated Provider Care Teams.  These Care  Teams include your primary  Cardiologist (physician) and Advanced Practice Providers (APPs -  Physician Assistants and Nurse Practitioners) who all work together to provide you with the care you need, when you need it.  We recommend signing up for the patient portal called "MyChart".  Sign up information is provided on this After Visit Summary.  MyChart is used to connect with patients for Virtual Visits (Telemedicine).  Patients are able to view lab/test results, encounter notes, upcoming appointments, etc.  Non-urgent messages can be sent to your provider as well.   To learn more about what you can do with MyChart, go to [email or web address].    Your next appointment:   6 month(s)  The format for your next appointment:   In Person  Provider:   Dr Donato Schultz   Thank you for choosing [facility]!!     Signed, Donato Schultz, MD  09/07/2021 5:05 PM    [facility]

## 2021-09-07 NOTE — Assessment & Plan Note (Addendum)
She stated that 2 of her brothers have had heart "blockages that have been opened up ".  Likely stent placement.  Given her diabetes, we will keep an eye on any possible chest discomfort that seems to be worsening.

## 2021-09-07 NOTE — Patient Instructions (Signed)
Medication Instructions:  The current medical regimen is effective;  continue present plan and medications.  *If you need a refill on your cardiac medications before your next appointment, please call your pharmacy*  Testing/Procedures: [place] Monitor Instructions  Your physician has requested you wear a ZIO patch monitor for 14 days.  This is a single patch monitor. [employer] supplies one patch monitor per enrollment. Additional stickers are not available. Please do not apply patch if you will be having a Nuclear Stress Test,  Echocardiogram, Cardiac CT, MRI, or Chest Xray during the period you would be wearing the  monitor. The patch cannot be worn during these tests. You cannot remove and re-apply the  ZIO XT patch monitor.  Your ZIO patch monitor will be mailed 3 day USPS to your address on file. It may take 3-5 days  to receive your monitor after you have been enrolled.  Once you have received your monitor, please review the enclosed instructions. Your monitor  has already been registered assigning a specific monitor serial # to you.  Billing and Patient Assistance Program Information  We have supplied [employer] with any of your insurance information on file for billing purposes. [employer] offers a sliding scale Patient Assistance Program for patients that do not have  insurance, or whose insurance does not completely cover the cost of the ZIO monitor.  You must apply for the Patient Assistance Program to qualify for this discounted rate.  To apply, please call [employer] at [phone], select option 4, select option 2, ask to apply for  Patient Assistance Program. Theodore Demark will ask your household income, and how many people  are in your household. They will quote your out-of-pocket cost based on that information.  [employer] will also be able to set up a 24-month interest-free payment plan if needed.  Applying the monitor   Shave hair from upper left chest.  Hold abrader disc  by orange tab. Rub abrader in 40 strokes over the upper left chest as  indicated in your monitor instructions.  Clean area with 4 enclosed alcohol pads. Let dry.  Apply patch as indicated in monitor instructions. Patch will be placed under collarbone on left  side of chest with arrow pointing upward.  Rub patch adhesive wings for 2 minutes. Remove white label marked "1". Remove the white  label marked "2". Rub patch adhesive wings for 2 additional minutes.  While looking in a mirror, press and release button in center of patch. A small green light will  flash 3-4 times. This will be your only indicator that the monitor has been turned on.  Do not shower for the first 24 hours. You may shower after the first 24 hours.  Press the button if you feel a symptom. You will hear a small click. Record Date, Time and  Symptom in the Patient Logbook.  When you are ready to remove the patch, follow instructions on the last 2 pages of Patient  Logbook. Stick patch monitor onto the last page of Patient Logbook.  Place Patient Logbook in the blue and white box. Use locking tab on box and tape box closed  securely. The blue and white box has prepaid postage on it. Please place it in the mailbox as  soon as possible. Your physician should have your test results approximately 7 days after the  monitor has been mailed back to [facility]  Call [phone] you have questions regarding  your ZIO XT patch  monitor. Call them immediately if you see an orange light blinking on your  monitor.  If your monitor falls off in less than 4 days, contact our Monitor department at [phone].  If your monitor becomes loose or falls off after 4 days call [employer] at [phone] for  suggestions on securing your monitor    Follow-Up: At [facility], you and your health needs are our priority.  As part of our continuing mission to provide you with exceptional heart care, we  have created designated Provider Care Teams.  These Care Teams include your primary Cardiologist (physician) and Advanced Practice Providers (APPs -  Physician Assistants and Nurse Practitioners) who all work together to provide you with the care you need, when you need it.  We recommend signing up for the patient portal called "MyChart".  Sign up information is provided on this After Visit Summary.  MyChart is used to connect with patients for Virtual Visits (Telemedicine).  Patients are able to view lab/test results, encounter notes, upcoming appointments, etc.  Non-urgent messages can be sent to your provider as well.   To learn more about what you can do with MyChart, go to [email or web address].    Your next appointment:   6 month(s)  The format for your next appointment:   In Person  Provider:   Dr Donato Schultz   Thank you for choosing [facility]!!

## 2021-09-07 NOTE — Assessment & Plan Note (Signed)
Given her diabetes, we would recommend statin therapy for further prevention.  If she is not placed on statin by the next visit, we will go ahead and initiate then.

## 2021-09-07 NOTE — Assessment & Plan Note (Signed)
We will go ahead and place a Zio patch monitor.  This will be helpful on identifying what her rapid heart rates are.  Also we want to ensure that she is not having any adverse arrhythmias.  We will see her back in 6 months to ensure that she does not have worsening symptoms.

## 2021-09-27 ENCOUNTER — Other Ambulatory Visit: Payer: Self-pay

## 2021-09-27 ENCOUNTER — Ambulatory Visit (INDEPENDENT_AMBULATORY_CARE_PROVIDER_SITE_OTHER): Payer: 59

## 2021-09-27 ENCOUNTER — Ambulatory Visit
Admission: EM | Admit: 2021-09-27 | Discharge: 2021-09-27 | Disposition: A | Discharge status: Home / Self Care | Payer: 59 | Attending: Family Medicine | Admitting: Family Medicine

## 2021-09-27 DIAGNOSIS — S92301A Fracture of unspecified metatarsal bone(s), right foot, initial encounter for closed fracture: Secondary | ICD-10-CM | POA: Diagnosis not present

## 2021-09-27 DIAGNOSIS — S92354A Nondisplaced fracture of fifth metatarsal bone, right foot, initial encounter for closed fracture: Secondary | ICD-10-CM | POA: Diagnosis not present

## 2021-09-27 MED ORDER — TRAMADOL HCL 50 MG PO TABS: 50.0000 mg | ORAL_TABLET | Freq: Two times a day (BID) | ORAL | 0 refills | Status: AC | PRN

## 2021-09-27 NOTE — ED Provider Notes (Signed)
[facility]    CSN: [id number] Arrival date & time: 09/27/21  1013      History   Chief Complaint No chief complaint on file.   HPI Kayla Marks is a 60 y.o. female.   Presenting today with 1 day history of right lateral foot pain after her grandson fell onto the foot this morning.  States she is not able to move her pinky toe on the side and has some mild numbness and tingling sensation.  She is able to bear weight on the heel portion of the foot but not on the distal portion.  Denies discoloration, significant swelling or bruising.  No past history of orthopedic issues in this area.   Past Medical History:  Diagnosis Date   Bell's palsy    history, approx 2011   Diabetes mellitus    metformin   Obesity    Palpitations    Retinopathy    Right eye symptoms    "nerve issue"   Weakness     Patient Active Problem List   Diagnosis Date Noted   Family history of heart disease in brother 09/07/2021   Diabetes mellitus without complication ([place]) 91/63/8466   Type 2 diabetes mellitus with hyperglycemia, without long-term current use of insulin ([place]) 07/01/2021   Spinal stenosis of lumbar region 07/01/2021   General unsteadiness 07/01/2021   Palpitations 07/01/2021   Calculus of gallbladder without cholecystitis without obstruction 11/20/2020   HYPERLIPIDEMIA-MIXED 10/09/2010   Overweight 10/09/2010   ABNORMAL EKG 10/09/2010    Past Surgical History:  Procedure Laterality Date   [place] N/A 11/26/2020   Procedure: LAPAROSCOPIC CHOLECYSTECTOMY;  Surgeon: Virl Cagey, MD;  Location: [facility];  Service: General;  Laterality: N/A;   GALLBLADDER SURGERY     February 2022 possibly?    OB History     Gravida  3   Para  3   Term  0   Preterm  0   AB  0   Living         SAB  0   IAB  0   Ectopic  0   Multiple      Live Births               Home Medications     Prior to Admission medications   Medication Sig Start Date End Date Taking? Authorizing Provider  traMADol (ULTRAM) 50 MG tablet Take 1 tablet (50 mg total) by mouth 2 (two) times daily as needed. 09/27/21  Yes Volney American, PA-C  Dulaglutide (TRULICITY) 1.5 ZL/9.3TT SOPN Inject 1.5 mg into the skin once a week. 07/01/21   Cletis Athens, MD  gabapentin (NEURONTIN) 100 MG capsule Take 1 capsule (100 mg total) by mouth 3 (three) times daily. 07/01/21   Cletis Athens, MD  metFORMIN (GLUCOPHAGE) 1000 MG tablet Take 1 tablet (1,000 mg total) by mouth in the morning and at bedtime. 08/19/21   Cletis Athens, MD    Family History Family History  Problem Relation Age of Onset   Other Father        complications of CVA   Other Sister        multiple myeloma   Coronary artery disease Brother    Diabetes Brother     Social History Social History   Tobacco Use   Smoking status: Never   Smokeless tobacco: Never  Vaping Use   Vaping Use: Never used  Substance Use Topics   Alcohol use: No   Drug use: No     Allergies   Insulins, Penicillin g, and Penicillins   Review of Systems Review of Systems Per HPI  Physical Exam Triage Vital Signs ED Triage Vitals  Enc Vitals Group     BP 09/27/21 1201 111/68     Pulse Rate 09/27/21 1201 73     Resp 09/27/21 1201 16     Temp 09/27/21 1201 97.7 F (36.5 C)     Temp Source 09/27/21 1201 Oral     SpO2 09/27/21 1201 92 %     Weight --      Height --      Head Circumference --      Peak Flow --      Pain Score 09/27/21 1159 10     Pain Loc --      Pain Edu? --      Excl. in [place]? --    No data found.  Updated Vital Signs BP 111/68 (BP Location: Right Arm)   Pulse 73   Temp 97.7 F (36.5 C) (Oral)   Resp 16   SpO2 92%   Visual Acuity Right Eye Distance:   Left Eye Distance:   Bilateral Distance:    Right Eye Near:   Left Eye Near:    Bilateral Near:     Physical Exam Vitals and nursing note reviewed.   Constitutional:      Appearance: Normal appearance. She is not ill-appearing.  HENT:     Head: Atraumatic.  Eyes:     Extraocular Movements: Extraocular movements intact.     Conjunctiva/sclera: Conjunctivae normal.  Cardiovascular:     Rate and Rhythm: Normal rate and regular rhythm.     Heart sounds: Normal heart sounds.  Pulmonary:     Effort: Pulmonary effort is normal.     Breath sounds: Normal breath sounds.  Musculoskeletal:     Cervical back: Normal range of motion and neck supple.     Comments: Antalgic gait.  Decreased motion of right fifth toe.  Significant tenderness to palpation fifth distal metatarsal  Skin:    General: Skin is warm and dry.  Neurological:     Mental Status: She is alert and oriented to person, place, and time.     Motor: No weakness.     Comments: Right foot neurovascularly intact  Psychiatric:        Mood and Affect: Mood normal.        Thought Content: Thought content normal.        Judgment: Judgment normal.     UC Treatments / Results  Labs (all labs ordered are listed, but only abnormal results are displayed) Labs Reviewed - No data to display  EKG   Radiology DG Foot Complete Right  Result Date: 09/27/2021 CLINICAL DATA:  Right foot injury EXAM: RIGHT FOOT COMPLETE - 3+ VIEW COMPARISON:  None. FINDINGS: Nondisplaced fracture of the base of the right fifth metatarsal. No other fracture or dislocation of the right foot. Joint spaces are preserved. Soft tissue edema and vascular calcinosis about the foot. IMPRESSION: 1. Nondisplaced fracture of the base of the right fifth metatarsal. No other fracture or dislocation of the right foot. 2. Soft tissue edema about the foot. Electronically Signed   By: Delanna Ahmadi M.D.   On: 09/27/2021 12:23    Procedures Procedures (including critical care time)  Medications Ordered in UC Medications - No data to display  Initial  Impression / Assessment and Plan / UC Course  I have reviewed the  triage vital signs and the nursing notes.  Pertinent labs & imaging results that were available during my care of the patient were reviewed by me and considered in my medical decision making (see chart for details).     X-ray of the right foot showing a right fifth metatarsal fracture, nondisplaced.  We will place in a cam boot and give crutches.  Nonweightbearing until orthopedic follow-up.  Very small supply of tramadol given for as needed pain relief, RICE protocol and over-the-counter pain relievers reviewed additionally.  Return for acutely worsening symptoms.  Final Clinical Impressions(s) / UC Diagnoses   Final diagnoses:  Fracture of unspecified metatarsal bone(s), right foot, initial encounter for closed fracture   Discharge Instructions   None    ED Prescriptions     Medication Sig Dispense Auth. Provider   traMADol (ULTRAM) 50 MG tablet Take 1 tablet (50 mg total) by mouth 2 (two) times daily as needed. 10 tablet Volney American, [place]      I have reviewed the PDMP during this encounter.   Volney American, [place] 09/27/21 1316

## 2021-09-27 NOTE — ED Triage Notes (Signed)
Patient states that this morning she stood up and her baby fell on top of right foot and she is in pain. Hard to walk.   Denies Meds

## 2021-09-28 ENCOUNTER — Ambulatory Visit: Payer: Self-pay

## 2021-09-29 ENCOUNTER — Telehealth: Payer: Self-pay | Admitting: Orthopedic Surgery

## 2021-09-29 NOTE — Telephone Encounter (Signed)
Call received from patient (son Onalee Hua) who is not on her contact list; states will have her call back to schedule appointment related to her visit at [facility] Emergency room.

## 2021-09-29 NOTE — Telephone Encounter (Signed)
Spoke w/patient; appointment scheduled; aware.

## 2021-09-30 ENCOUNTER — Other Ambulatory Visit: Payer: Self-pay

## 2021-09-30 ENCOUNTER — Ambulatory Visit (INDEPENDENT_AMBULATORY_CARE_PROVIDER_SITE_OTHER): Payer: 59 | Admitting: Orthopedic Surgery

## 2021-09-30 ENCOUNTER — Encounter: Payer: Self-pay | Admitting: Orthopedic Surgery

## 2021-09-30 VITALS — BP 114/76 | HR 78 | Ht <= 58 in | Wt 144.6 lb

## 2021-09-30 DIAGNOSIS — M25571 Pain in right ankle and joints of right foot: Secondary | ICD-10-CM | POA: Diagnosis not present

## 2021-09-30 DIAGNOSIS — S92354A Nondisplaced fracture of fifth metatarsal bone, right foot, initial encounter for closed fracture: Secondary | ICD-10-CM | POA: Diagnosis not present

## 2021-09-30 NOTE — Progress Notes (Signed)
New Patient Visit  Assessment: Kayla Marks is a 60 y.o. female with the following: 1. Closed nondisplaced fracture of fifth metatarsal bone of right foot, initial encounter 2. Pain in right ankle and joints of right foot  Plan: Reviewed radiographs with the patient in clinic today.  These demonstrates a minimally displaced fracture of the base the fifth metatarsal.  She does not have any pain in this area.  She states she rolled her ankle at least couple of weeks ago, and this may be the reason for these x-ray findings.  More recently, she had direct impact over the dorsal and lateral aspect of the foot.  She has some pain around the ankle.  She is just recently started to come out of the boot, to apply pressure to the right leg.  Anticipate that this will continue to improve.  Provided her with ankle sprain exercises.  Medications as needed.  Follow-up as needed.   Follow-up: Return if symptoms worsen or fail to improve.  Subjective:  Chief Complaint  Patient presents with   New Patient (Initial Visit)   Foot Injury    DOI 09/27/21    History of Present Illness: Kayla Marks is a 60 y.o. female who presents for evaluation of right foot pain.  She states that she rolled her ankle at least 2 weeks ago, and had some pain at that time.  More recently, her grandson landed directly onto the anterior and lateral aspect of the ankle.  Since then, she has had worsening pain.  She has been wearing a boot, which assist with ambulation.  She just recently took the boot off, and has been able to bear some weight.  She is been taking medications such as ibuprofen and Tylenol, with minimal improvement in her symptoms.  She also endorses some tingling in her toes.   Review of Systems: No fevers or chills  Tingling of the toes No chest pain No shortness of breath No bowel or bladder dysfunction No GI distress No headaches   Medical History:  Past Medical History:  Diagnosis Date   Bell's  palsy    history, approx 2011   Diabetes mellitus    metformin   Obesity    Palpitations    Retinopathy    Right eye symptoms    "nerve issue"   Weakness     Past Surgical History:  Procedure Laterality Date   [place] N/A 11/26/2020   Procedure: LAPAROSCOPIC CHOLECYSTECTOMY;  Surgeon: Virl Cagey, MD;  Location: [facility];  Service: General;  Laterality: N/A;   GALLBLADDER SURGERY     February 2022 possibly?    Family History  Problem Relation Age of Onset   Other Father        complications of CVA   Other Sister        multiple myeloma   Coronary artery disease Brother    Diabetes Brother    Social History   Tobacco Use   Smoking status: Never   Smokeless tobacco: Never  Vaping Use   Vaping Use: Never used  Substance Use Topics   Alcohol use: No   Drug use: No    Allergies  Allergen Reactions   Insulins Nausea And Vomiting    Unknown insulin, injectible Lantus solostar    Penicillin G    Penicillins Rash    Current Meds  Medication Sig   Dulaglutide (TRULICITY) 1.5 ID/5.6YS SOPN Inject  1.5 mg into the skin once a week.   gabapentin (NEURONTIN) 100 MG capsule Take 1 capsule (100 mg total) by mouth 3 (three) times daily.   metFORMIN (GLUCOPHAGE) 1000 MG tablet Take 1 tablet (1,000 mg total) by mouth in the morning and at bedtime.   traMADol (ULTRAM) 50 MG tablet Take 1 tablet (50 mg total) by mouth 2 (two) times daily as needed.    Objective: BP 114/76   Pulse 78   Ht '4\' 8"'  (1.422 m)   Wt 144 lb 9.6 oz (65.6 kg)   BMI 32.42 kg/m   Physical Exam:  General: Alert and oriented. and No acute distress. Gait: Right sided antalgic gait.  Evaluation of the right foot demonstrates minimal swelling.  No bruising is appreciated.  Mild tenderness to palpation over the anterior and lateral aspect of the ankle.  Minimal tenderness to palpation at the base of the fifth metatarsal.  She does  have some tenderness to palpation of the distal aspect of the fifth metatarsal.  Sensation is intact over the dorsum of her foot.  2+ DP pulse.  IMAGING: I personally reviewed images previously obtained from the ED  X-ray of the right foot demonstrates minimally displaced fracture of the base of fifth metatarsal.  No additional injuries are noted.  No acute dislocations.  New Medications:  No orders of the defined types were placed in this encounter.     Mordecai Rasmussen, MD  09/30/2021 12:21 PM

## 2021-09-30 NOTE — Patient Instructions (Signed)
Instructions  1.  You have sustained an ankle sprain  2.  I encourage you to stay on your feet and gradually remove your walking boot.   3.  Below are some exercises that you can complete on your own to improve your symptoms.  4.  As an alternative, you can search for ankle sprain exercises online, and can see some demonstrations on YouTube  5.  If you are having difficulty with these exercises, we can also prescribe formal physical therapy  Ankle Exercises Ask your health care provider which exercises are safe for you. Do exercises exactly as told by your health care provider and adjust them as directed. It is normal to feel mild stretching, pulling, tightness, or mild discomfort as you do these exercises. Stop right away if you feel sudden pain or your pain gets worse. Do not begin these exercises until told by your health care provider.  Stretching and range-of-motion exercises These exercises warm up your muscles and joints and improve the movement and flexibility of your ankle. These exercises may also help to relieve pain.  Dorsiflexion/plantar flexion  Sit with your R knee straight or bent. Do not rest your foot on anything. Flex your left ankle to tilt the top of your foot toward your shin. This is called dorsiflexion. Hold this position for 5 seconds. Point your toes downward to tilt the top of your foot away from your shin. This is called plantar flexion. Hold this position for 5 seconds. Repeat 10 times. Complete this exercise 2-3 times a day.  As tolerated  Ankle alphabet  Sit with your R foot supported at your lower leg. Do not rest your foot on anything. Make sure your foot has room to move freely. Think of your R foot as a paintbrush: Move your foot to trace each letter of the alphabet in the air. Keep your hip and knee still while you trace the letters. Trace every letter from A to Z. Make the letters as large as you can without causing or increasing any  discomfort.  Repeat 2-3 times. Complete this exercise 2-3 times a day.   Strengthening exercises These exercises build strength and endurance in your ankle. Endurance is the ability to use your muscles for a long time, even after they get tired. Dorsiflexors These are muscles that lift your foot up. Secure a rubber exercise band or tube to an object, such as a table leg, that will stay still when the band is pulled. Secure the other end around your R foot. Sit on the floor, facing the object with your R leg extended. The band or tube should be slightly tense when your foot is relaxed. Slowly flex your R ankle and toes to bring your foot toward your shin. Hold this position for 5 seconds. Slowly return your foot to the starting position, controlling the band as you do that. Repeat 10 times. Complete this exercise 2-3 times a day.  Plantar flexors These are muscles that push your foot down. Sit on the floor with your R leg extended. Loop a rubber exercise band or tube around the ball of your R foot. The ball of your foot is on the walking surface, right under your toes. The band or tube should be slightly tense when your foot is relaxed. Slowly point your toes downward, pushing them away from you. Hold this position for 5 seconds. Slowly release the tension in the band or tube, controlling smoothly until your foot is back in the starting  position. Repeat 10 times. Complete this exercise 2-3 times a day.  Towel curls  Sit in a chair on a non-carpeted surface, and put your feet on the floor. Place a towel in front of your feet. Keeping your heel on the floor, put your R foot on the towel. Pull the towel toward you by grabbing the towel with your toes and curling them under. Keep your heel on the floor. Let your toes relax. Grab the towel again. Keep pulling the towel until it is completely underneath your foot. Repeat 10 times. Complete this exercise 2-3 times a day.  Standing plantar  flexion This is an exercise in which you use your toes to lift your body's weight while standing. Stand with your feet shoulder-width apart. Keep your weight spread evenly over the width of your feet while you rise up on your toes. Use a wall or table to steady yourself if needed, but try not to use it for support. If this exercise is too easy, try these options: Shift your weight toward your R leg until you feel challenged. If told by your health care provider, lift your uninjured leg off the floor. Hold this position for 5 seconds. Repeat 10 times. Complete this exercise 2-3 times a day.  Tandem walking Stand with one foot directly in front of the other. Slowly raise your back foot up, lifting your heel before your toes, and place it directly in front of your other foot. Continue to walk in this heel-to-toe way. Have a countertop or wall nearby to use if needed to keep your balance, but try not to hold onto anything for support.  Repeat 10 times. Complete this exercise 2-3 times a day.   Document Revised: 07/22/2018 Document Reviewed: 07/24/2018 Elsevier Patient Education  2020 [email or web address]. R

## 2021-10-06 ENCOUNTER — Encounter: Payer: Self-pay | Admitting: *Deleted

## 2021-10-14 LAB — HM DIABETES EYE EXAM

## 2021-10-15 ENCOUNTER — Encounter: Payer: Self-pay | Admitting: *Deleted

## 2021-10-16 ENCOUNTER — Other Ambulatory Visit: Payer: Self-pay

## 2021-10-16 ENCOUNTER — Ambulatory Visit (INDEPENDENT_AMBULATORY_CARE_PROVIDER_SITE_OTHER): Payer: 59

## 2021-10-16 ENCOUNTER — Ambulatory Visit: Payer: Medicaid Other | Admitting: Internal Medicine

## 2021-10-16 DIAGNOSIS — E1165 Type 2 diabetes mellitus with hyperglycemia: Secondary | ICD-10-CM

## 2021-10-16 DIAGNOSIS — Z0000 Encounter for general adult medical examination without abnormal findings: Secondary | ICD-10-CM | POA: Diagnosis not present

## 2021-10-16 DIAGNOSIS — Z23 Encounter for immunization: Secondary | ICD-10-CM | POA: Diagnosis not present

## 2021-10-16 DIAGNOSIS — Z1231 Encounter for screening mammogram for malignant neoplasm of breast: Secondary | ICD-10-CM

## 2021-10-16 DIAGNOSIS — Z1211 Encounter for screening for malignant neoplasm of colon: Secondary | ICD-10-CM

## 2021-10-16 DIAGNOSIS — Z124 Encounter for screening for malignant neoplasm of cervix: Secondary | ICD-10-CM

## 2021-10-19 LAB — HEMOGLOBIN A1C
Hgb A1c MFr Bld: 10 % of total Hgb — ABNORMAL HIGH (ref <5.7)
Mean Plasma Glucose: 240 mg/dL
eAG (mmol/L): 13.3 mmol/L

## 2021-10-19 LAB — HEPATITIS C ANTIBODY
Hepatitis C Ab: NONREACTIVE
SIGNAL TO CUT-OFF: 0.03 (ref <1.00)

## 2021-10-19 LAB — MICROALBUMIN, URINE

## 2021-10-19 LAB — HIV ANTIBODY (ROUTINE TESTING W REFLEX): HIV 1&2 Ab, 4th Generation: NONREACTIVE

## 2021-10-22 ENCOUNTER — Telehealth: Payer: Self-pay

## 2021-10-22 ENCOUNTER — Encounter (INDEPENDENT_AMBULATORY_CARE_PROVIDER_SITE_OTHER): Payer: Medicaid Other | Admitting: Ophthalmology

## 2021-10-22 NOTE — Telephone Encounter (Signed)
CALLED PATIENT NO ANSWER LEFT VOICEMAIL FOR A CALL BACK ? ?

## 2021-11-11 ENCOUNTER — Ambulatory Visit: Payer: 59 | Admitting: Orthopedic Surgery

## 2021-11-11 ENCOUNTER — Encounter: Payer: Self-pay | Admitting: Orthopedic Surgery

## 2021-11-19 ENCOUNTER — Encounter: Payer: Self-pay | Admitting: Internal Medicine

## 2021-11-25 ENCOUNTER — Other Ambulatory Visit: Payer: Self-pay

## 2021-11-25 ENCOUNTER — Encounter: Payer: Self-pay | Admitting: Orthopedic Surgery

## 2021-11-25 ENCOUNTER — Ambulatory Visit: Payer: Medicaid Other

## 2021-11-25 ENCOUNTER — Ambulatory Visit (INDEPENDENT_AMBULATORY_CARE_PROVIDER_SITE_OTHER): Payer: Self-pay | Admitting: Orthopedic Surgery

## 2021-11-25 VITALS — Ht <= 58 in | Wt 144.0 lb

## 2021-11-25 DIAGNOSIS — S92354D Nondisplaced fracture of fifth metatarsal bone, right foot, subsequent encounter for fracture with routine healing: Secondary | ICD-10-CM

## 2021-11-25 DIAGNOSIS — M79671 Pain in right foot: Secondary | ICD-10-CM

## 2021-11-25 NOTE — Progress Notes (Signed)
New Patient Visit  Assessment: Kayla Marks is a 61 y.o. female with the following: 1. Closed nondisplaced fracture of fifth metatarsal bone of right foot, initial encounter 2. Pain in right ankle and joints of right foot  Plan: Repeat radiographs of the right foot demonstrates interval consolidation of the minimally displaced fifth metatarsal fracture.  She has no swelling, bruising throughout the right foot.  Overall, I think her general aches and pains are residual from her ankle sprain and fracture.  I recommended physical therapy, but she is not interested.  I have provided her with ankle sprain exercises, and urged her to continue working on these.  I reassured her that the bone is healing, and her other aches and pains are residual to her prior injury.  She stated her understanding.  Visit was completed with the assistance of a Spanish interpreter.  Follow-up as needed.   Follow-up: Return if symptoms worsen or fail to improve.  Subjective:  Chief Complaint  Patient presents with   Fracture    Rt foot fx care DOI 09/27/21    History of Present Illness: Kayla Marks is a 61 y.o. female who presents for evaluation of right foot pain.  She sustained a twisting injury to her right ankle, which included a fracture at the base of the fifth metatarsal.  This was done approximately 2 months ago.  She continues to have some residual pain.  She is now having pain across the metatarsal heads, on the plantar aspect.  Pain gets worse at night.  She is walking in a regular shoe.  She is not done any exercises for her ankle.  No physical therapy.  Does not take any medications on a regular basis.  Review of Systems: No fevers or chills  Tingling of the toes No chest pain No shortness of breath No bowel or bladder dysfunction No GI distress No headaches  Objective: Ht 4\' 8"  (1.422 m)    Wt 144 lb (65.3 kg)    BMI 32.28 kg/m   Physical Exam:  General: Alert and oriented. and No acute  distress. Gait: Right sided antalgic gait.  No swelling of the right foot.  No bruising is appreciated.  Mild tenderness to palpation of the base of the fifth metatarsal.  She also has tenderness to palpation over the distal extent of the fibula.  Sensations intact over the dorsum of the foot.  Toes are warm and well-perfused.  No evidence of an acute injury.   IMAGING: I personally reviewed images previously obtained from the ED  Standing x-rays of the right foot were obtained in clinic today.  The fracture at the base of the fifth metatarsal remains visible, but with some interval consolidation.  No additional injuries are noted.  No dislocations.  Impression: Healing right fifth metatarsal fracture  New Medications:  No orders of the defined types were placed in this encounter.     , MD  11/25/2021 10:20 PM

## 2021-11-25 NOTE — Patient Instructions (Signed)
Instructions ° °1.  You have sustained an ankle sprain, or similar exercises that can be treated as an ankle sprain.  **These exercises can also be used as part of recovery from an ankle fracture.  °2.  I encourage you to stay on your feet and gradually remove your walking boot.   °3.  Below are some exercises that you can complete on your own to improve your symptoms.  °4.  As an alternative, you can search for ankle sprain exercises online, and can see some demonstrations on YouTube  °5.  If you are having difficulty with these exercises, we can also prescribe formal physical therapy ° °Ankle Exercises °Ask your health care provider which exercises are safe for you. Do exercises exactly as told by your health care provider and adjust them as directed. It is normal to feel mild stretching, pulling, tightness, or mild discomfort as you do these exercises. Stop right away if you feel sudden pain or your pain gets worse. Do not begin these exercises until told by your health care provider. ° °Stretching and range-of-motion exercises °These exercises warm up your muscles and joints and improve the movement and flexibility of your ankle. These exercises may also help to relieve pain. ° °Dorsiflexion/plantar flexion ° °Sit with your R knee straight or bent. Do not rest your foot on anything. °Flex your left ankle to tilt the top of your foot toward your shin. This is called dorsiflexion. °Hold this position for 5 seconds. °Point your toes downward to tilt the top of your foot away from your shin. This is called plantar flexion. °Hold this position for 5 seconds. °Repeat 10 times. Complete this exercise 2-3 times a day.  As tolerated ° °Ankle alphabet ° °Sit with your R foot supported at your lower leg. °Do not rest your foot on anything. °Make sure your foot has room to move freely. °Think of your R foot as a paintbrush: °Move your foot to trace each letter of the alphabet in the air. Keep your hip and knee still while  you trace the letters. Trace every letter from A to Z. °Make the letters as large as you can without causing or increasing any discomfort. ° °Repeat 2-3 times. Complete this exercise 2-3 times a day. ° ° °Strengthening exercises °These exercises build strength and endurance in your ankle. Endurance is the ability to use your muscles for a long time, even after they get tired. °Dorsiflexors °These are muscles that lift your foot up. °Secure a rubber exercise band or tube to an object, such as a table leg, that will stay still when the band is pulled. Secure the other end around your R foot. °Sit on the floor, facing the object with your R leg extended. The band or tube should be slightly tense when your foot is relaxed. °Slowly flex your R ankle and toes to bring your foot toward your shin. °Hold this position for 5 seconds. °Slowly return your foot to the starting position, controlling the band as you do that. °Repeat 10 times. Complete this exercise 2-3 times a day. ° °Plantar flexors °These are muscles that push your foot down. °Sit on the floor with your R leg extended. °Loop a rubber exercise band or tube around the ball of your R foot. The ball of your foot is on the walking surface, right under your toes. The band or tube should be slightly tense when your foot is relaxed. °Slowly point your toes downward, pushing them away from   you. °Hold this position for 5 seconds. °Slowly release the tension in the band or tube, controlling smoothly until your foot is back in the starting position. °Repeat 10 times. Complete this exercise 2-3 times a day. ° °Towel curls ° °Sit in a chair on a non-carpeted surface, and put your feet on the floor. °Place a towel in front of your feet. °Keeping your heel on the floor, put your R foot on the towel. °Pull the towel toward you by grabbing the towel with your toes and curling them under. Keep your heel on the floor. °Let your toes relax. °Grab the towel again. Keep pulling the  towel until it is completely underneath your foot. °Repeat 10 times. Complete this exercise 2-3 times a day. ° °Standing plantar flexion °This is an exercise in which you use your toes to lift your body's weight while standing. °Stand with your feet shoulder-width apart. °Keep your weight spread evenly over the width of your feet while you rise up on your toes. Use a wall or table to steady yourself if needed, but try not to use it for support. °If this exercise is too easy, try these options: °Shift your weight toward your R leg until you feel challenged. °If told by your health care provider, lift your uninjured leg off the floor. °Hold this position for 5 seconds. °Repeat 10 times. Complete this exercise 2-3 times a day. ° °Tandem walking °Stand with one foot directly in front of the other. °Slowly raise your back foot up, lifting your heel before your toes, and place it directly in front of your other foot. °Continue to walk in this heel-to-toe way. Have a countertop or wall nearby to use if needed to keep your balance, but try not to hold onto anything for support. ° °Repeat 10 times. Complete this exercise 2-3 times a day. ° ° °Document Revised: 07/22/2018 Document Reviewed: 07/24/2018 °Elsevier Patient Education © 2020 [employer] ° °

## 2021-11-30 ENCOUNTER — Ambulatory Visit: Payer: Medicaid Other | Admitting: Internal Medicine

## 2021-12-08 ENCOUNTER — Other Ambulatory Visit: Payer: Self-pay

## 2021-12-08 ENCOUNTER — Encounter: Payer: Self-pay | Admitting: Internal Medicine

## 2021-12-08 ENCOUNTER — Ambulatory Visit (INDEPENDENT_AMBULATORY_CARE_PROVIDER_SITE_OTHER): Payer: Self-pay | Admitting: Internal Medicine

## 2021-12-08 VITALS — BP 108/58 | HR 72 | Ht <= 58 in | Wt 147.0 lb

## 2021-12-08 DIAGNOSIS — Q159 Congenital malformation of eye, unspecified: Secondary | ICD-10-CM

## 2021-12-08 DIAGNOSIS — E119 Type 2 diabetes mellitus without complications: Secondary | ICD-10-CM

## 2021-12-08 DIAGNOSIS — R2681 Unsteadiness on feet: Secondary | ICD-10-CM

## 2021-12-08 DIAGNOSIS — E663 Overweight: Secondary | ICD-10-CM

## 2021-12-08 DIAGNOSIS — R002 Palpitations: Secondary | ICD-10-CM

## 2021-12-08 LAB — GLUCOSE, POCT (MANUAL RESULT ENTRY): POC Glucose: 148 mg/dl — AB (ref 70–99)

## 2021-12-08 NOTE — Assessment & Plan Note (Addendum)
Patient complaining of eye pain on the right side, I did not find any abnormality extraocular movements are full, no scratching noted, I asked her to use artificial tears eye doctor she needs to see an eye doctor (diabetic]

## 2021-12-08 NOTE — Assessment & Plan Note (Signed)
Resolved

## 2021-12-08 NOTE — Assessment & Plan Note (Signed)

## 2021-12-08 NOTE — Progress Notes (Signed)
Established Patient Office Visit  Subjective:  Patient ID: Kayla Marks, female    DOB: [birth year]  Age: 61 y.o. MRN: [id number]  CC:  Chief Complaint  Patient presents with   Diabetes    Diabetes She has type 2 diabetes mellitus. Pertinent negatives for hypoglycemia include no nervousness/anxiousness, seizures or speech difficulty. Associated symptoms include blurred vision. Pertinent negatives for diabetes include no weight loss. Pertinent negatives for hypoglycemia complications include no hospitalization. Risk factors for coronary artery disease include obesity. She is following a diabetic diet.   Kayla Marks presents for eye irritation  Past Medical History:  Diagnosis Date   Bell's palsy    history, approx 2011   Diabetes mellitus    metformin   Obesity    Palpitations    Retinopathy    Right eye symptoms    "nerve issue"   Weakness     Past Surgical History:  Procedure Laterality Date   [place] N/A 11/26/2020   Procedure: LAPAROSCOPIC CHOLECYSTECTOMY;  Surgeon: Virl Cagey, MD;  Location: [facility];  Service: General;  Laterality: N/A;   GALLBLADDER SURGERY     February 2022 possibly?    Family History  Problem Relation Age of Onset   Other Father        complications of CVA   Other Sister        multiple myeloma   Coronary artery disease Brother    Diabetes Brother     Social History   Socioeconomic History   Marital status: Married    Spouse name: [place]   Number of children: Not on file   Years of education: 12   Highest education level: Not on file  Occupational History   Not on file  Tobacco Use   Smoking status: Never   Smokeless tobacco: Never  Vaping Use   Vaping Use: Never used  Substance and Sexual Activity   Alcohol use: No   Drug use: No   Sexual activity: Yes    Birth control/protection: Surgical  Other Topics Concern   Not on file  Social History  Narrative   ** Merged History Encounter **       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   Social Determinants of Health   Financial Resource Strain: Not on file  Food Insecurity: Not on file  Transportation Needs: Not on file  Physical Activity: Not on file  Stress: Not on file  Social Connections: Not on file  Intimate Partner Violence: Not on file     Current Outpatient Medications:    Dulaglutide (TRULICITY) 1.5 JI/3.60YO SOPN, Inject 1.5 mg into the skin once a week., Disp: 30 mL, Rfl: 4   gabapentin (NEURONTIN) 100 MG capsule, Take 1 capsule (100 mg total) by mouth 3 (three) times daily., Disp: 90 capsule, Rfl: 3   metFORMIN (GLUCOPHAGE) 1000 MG tablet, Take 1 tablet (1,000 mg total) by mouth in the morning and at bedtime., Disp: 180 tablet, Rfl: 2   traMADol (ULTRAM) 50 MG tablet, Take 1 tablet (50 mg total) by mouth 2 (two) times daily as needed., Disp: 10 tablet, Rfl: 0   Allergies  Allergen Reactions   Insulins Nausea And Vomiting    Unknown insulin, injectible Lantus solostar    Penicillin G    Penicillins Rash    ROS Review of Systems  Constitutional:  Negative for weight loss.  Eyes:  Positive for blurred vision.  Neurological:  Negative for seizures and speech difficulty.  Psychiatric/Behavioral:  The patient is not nervous/anxious.      Objective:    Physical Exam Vitals reviewed.  Constitutional:      Appearance: Normal appearance.  HENT:     Mouth/Throat:     Mouth: Mucous membranes are moist.  Eyes:     Pupils: Pupils are equal, round, and reactive to light.  Neck:     Vascular: No carotid bruit.  Cardiovascular:     Rate and Rhythm: Normal rate and regular rhythm.     Pulses: Normal pulses.     Heart sounds: Normal heart sounds.  Pulmonary:     Effort: Pulmonary effort is normal.     Breath sounds: Normal breath sounds.  Abdominal:     General: Bowel  sounds are normal.     Palpations: Abdomen is soft. There is no hepatomegaly, splenomegaly or mass.     Tenderness: There is no abdominal tenderness.     Hernia: No hernia is present.  Musculoskeletal:        General: No tenderness.     Cervical back: Neck supple.     Right lower leg: No edema.     Left lower leg: No edema.  Skin:    Findings: No rash.  Neurological:     Mental Status: She is alert and oriented to person, place, and time.     Motor: No weakness.  Psychiatric:        Mood and Affect: Mood and affect normal.        Behavior: Behavior normal.    BP (!) 108/58    Pulse 72    Ht '4\' 8"'  (1.422 m)    Wt 147 lb (66.7 kg)    BMI 32.96 kg/m  Wt Readings from Last 3 Encounters:  12/08/21 147 lb (66.7 kg)  11/25/21 144 lb (65.3 kg)  09/30/21 144 lb 9.6 oz (65.6 kg)     Health Maintenance Due  Topic Date Due   FOOT EXAM  Never done   PAP SMEAR-Modifier  Never done   Zoster Vaccines- Shingrix (1 of 2) Never done   MAMMOGRAM  08/25/2019   COVID-19 Vaccine (3 - Booster for Moderna series) 06/06/2020    There are no preventive care reminders to display for this patient.  Lab Results  Component Value Date   TSH 2.18 07/06/2021   Lab Results  Component Value Date   WBC 6.5 07/06/2021   HGB 15.5 07/06/2021   HCT 47.8 (H) 07/06/2021   MCV 94.8 07/06/2021   PLT 312 07/06/2021   Lab Results  Component Value Date   NA 139 07/06/2021   K 4.1 07/06/2021   CO2 24 07/06/2021   GLUCOSE 225 (H) 07/06/2021   BUN 10 07/06/2021   CREATININE 0.55 07/06/2021   BILITOT 0.7 07/06/2021   ALKPHOS 78 11/14/2020   AST 16 07/06/2021   ALT 20 07/06/2021   PROT 7.1 07/06/2021   ALBUMIN 4.3 11/14/2020   CALCIUM 9.6 07/06/2021   ANIONGAP 9 11/14/2020   EGFR 106 07/06/2021   Lab Results  Component Value Date   CHOL 198 07/06/2021   Lab Results  Component Value Date   HDL 55 07/06/2021   Lab Results  Component Value Date   LDLCALC 106 (H) 07/06/2021   Lab Results   Component Value Date   TRIG 258 (H) 07/06/2021   Lab  Results  Component Value Date   CHOLHDL 3.6 07/06/2021   Lab Results  Component Value Date   HGBA1C 10.0 (H) 10/16/2021      Assessment & Plan:  1. Type 2 diabetes mellitus without complication, without long-term current use of insulin (HCC) Stable- The patient's blood sugar is labile on med. - The patient will continue the current treatment regimen.  - I encouraged the patient to regularly check blood sugar.  - I encouraged the patient to monitor diet. I encouraged the patient to eat low-carb and low-sugar to help prevent blood sugar spikes.  - I encouraged the patient to continue following their prescribed treatment plan for diabetes - I informed the patient to get help if blood sugar drops below 29m/dL, or if suddenly have trouble thinking clearly or breathing.  Patient was advised to buy a book on diabetes from a local bookstore or from [place]  Patient should read 2 chapters every day to keep the motivation going, this is in addition to some of the materials we provided them from the office.  There are other resources on the Internet like YouTube and wilkipedia to get an education on the diabetes - POCT glucose (manual entry)    3. Overweight - I encouraged the patient to lose weight.  - I educated them on making healthy dietary choices including eating more fruits and vegetables and less fried foods. - I encouraged the patient to exercise more, and educated on the benefits of exercise including weight loss, diabetes prevention, and hypertension prevention.   Dietary counseling with a registered dietician  Referral to a weight management support group (e.g. Weight Watchers, Overeaters Anonymous)  If your BMI is greater than 29 or you have gained more than 15 pounds you should work on weight loss.  Attend a healthy cooking class  4. General unsteadiness Patient was advised to lose weight and walk daily  5.  Palpitations Stable at the present time  6. Eye abnormalities Refer to the eye doctor  No orders of the defined types were placed in this encounter.   Follow-up: No follow-ups on file.    JCletis Athens MD

## 2021-12-08 NOTE — Assessment & Plan Note (Signed)

## 2021-12-08 NOTE — Assessment & Plan Note (Signed)
Stable

## 2021-12-12 IMAGING — US US ABDOMEN LIMITED
1 series · 14 of 25 positions shown · non-contrast
Comparison: Abdominopelvic CT yesterday.

CLINICAL DATA: Right upper quadrant pain with abnormal CT.

EXAM:
ULTRASOUND ABDOMEN LIMITED RIGHT UPPER QUADRANT

[Series 1: us abdomen limited ruq (liver/gb) · 14 of 40 slices shown]
[im 1/40]
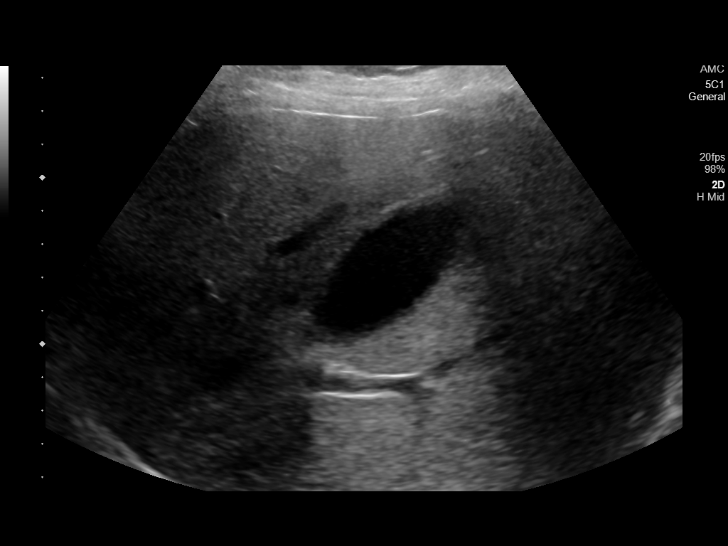
[im 4/40]
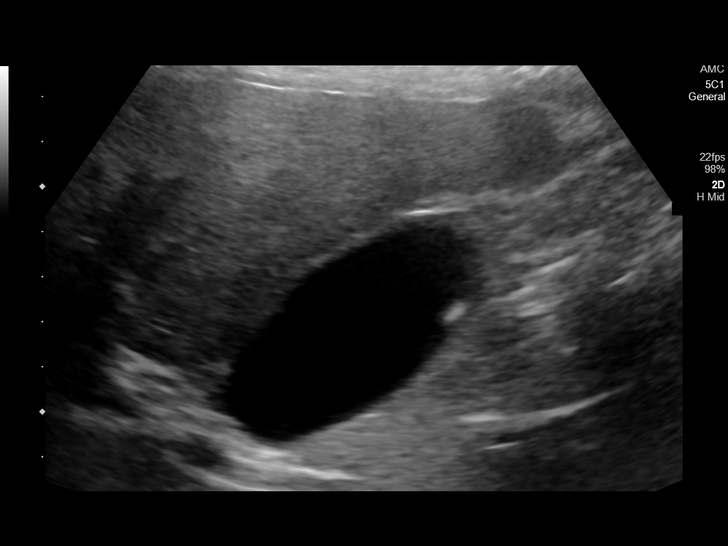
[im 7/40]
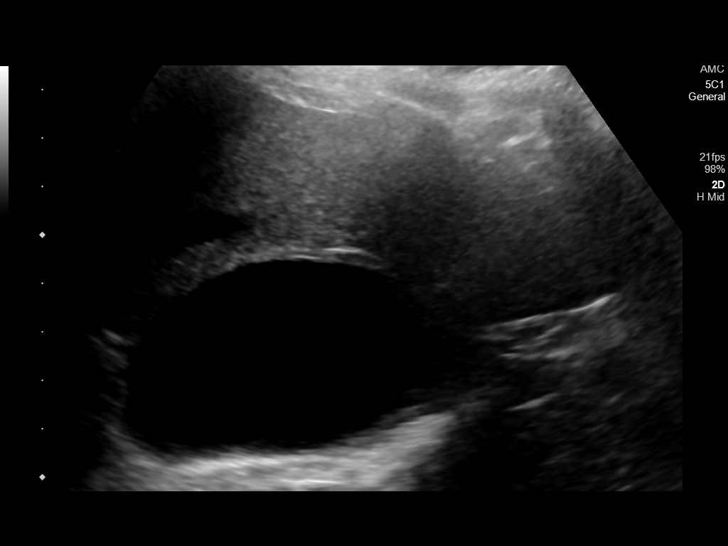
[im 10/40]
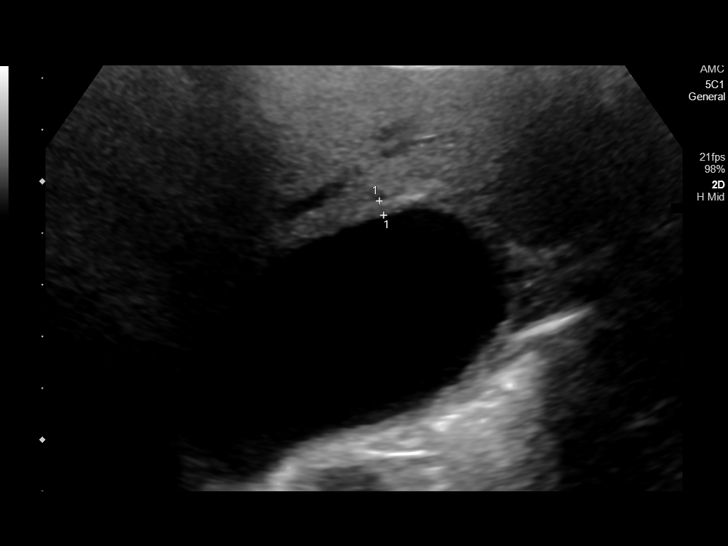
[im 14/40]
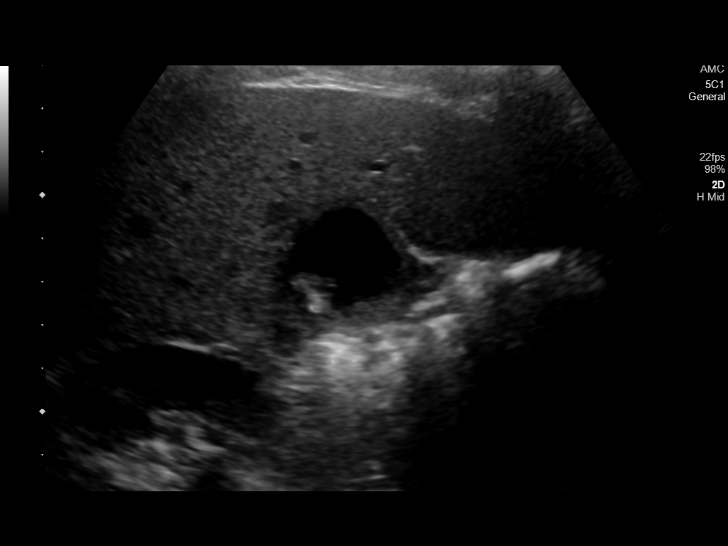
[im 15/40]
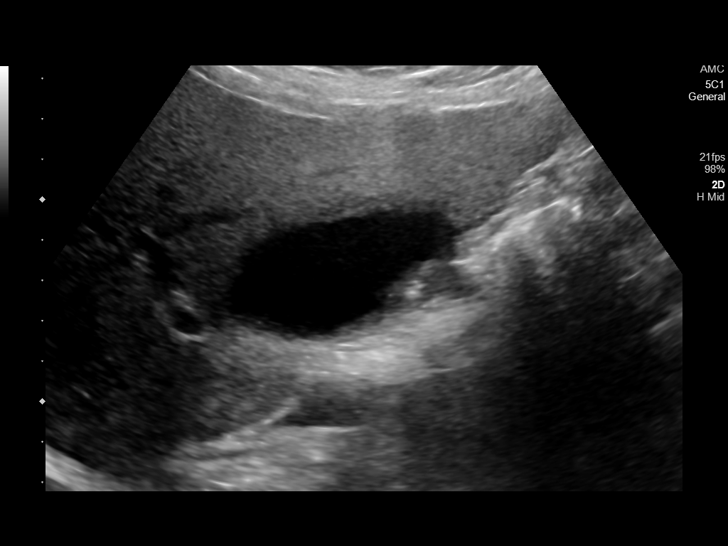
[im 18/40]
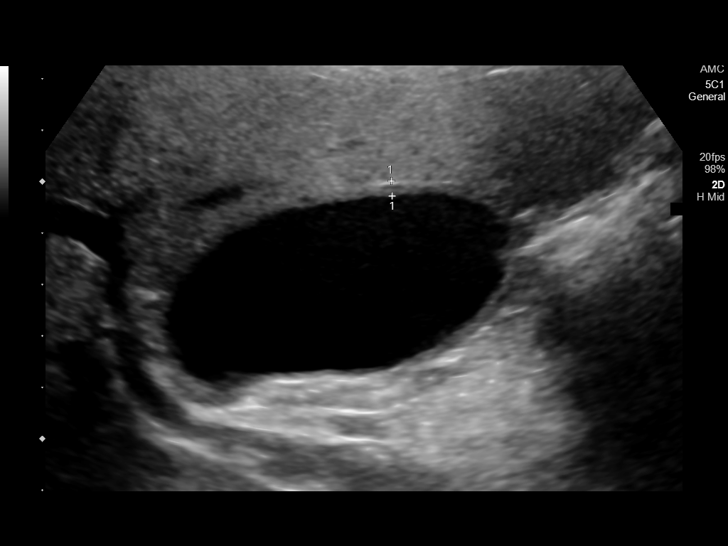
[im 22/40]
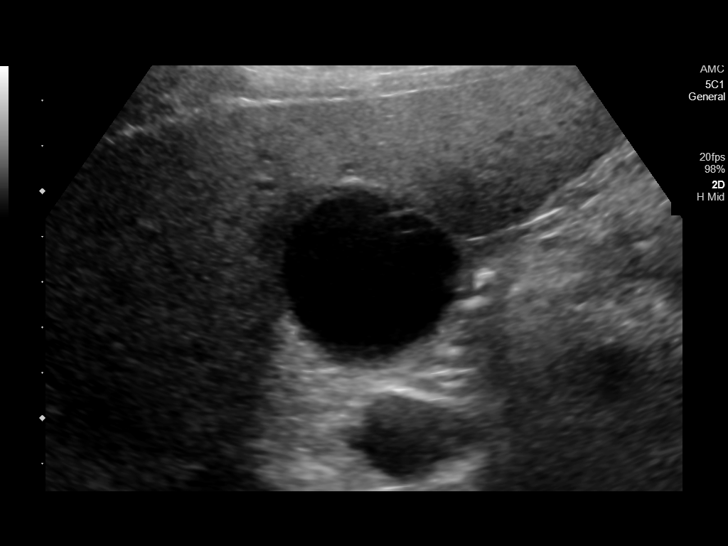
[im 25/40]
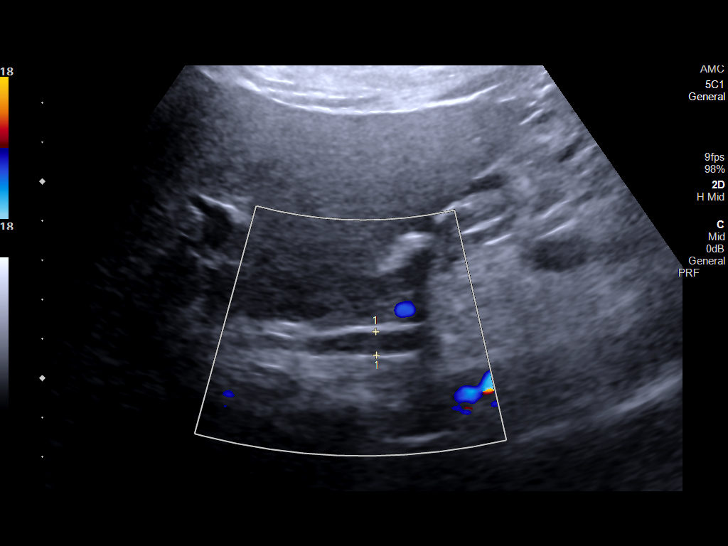
[im 27/40]
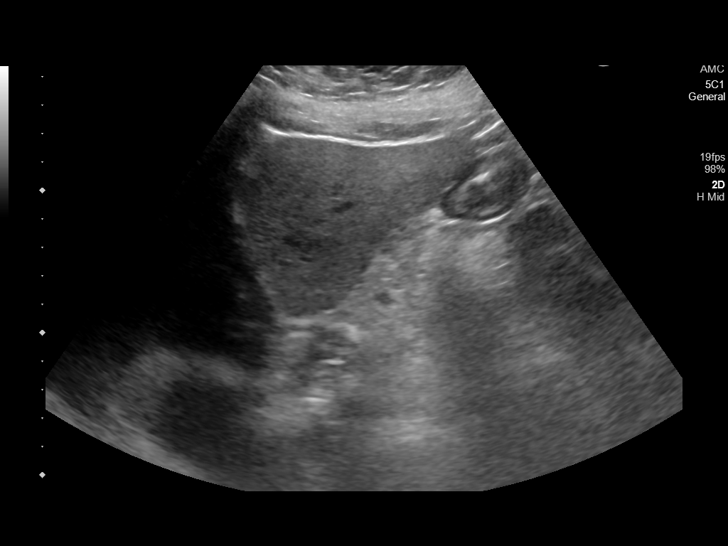
[im 30/40]
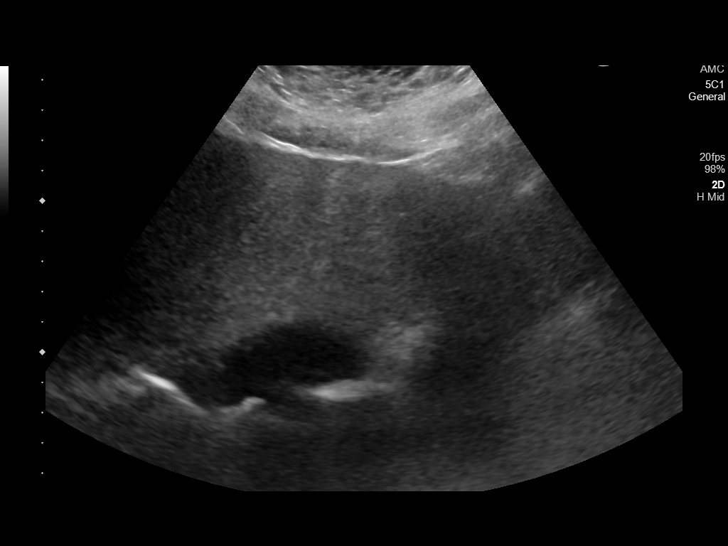
[im 33/40]
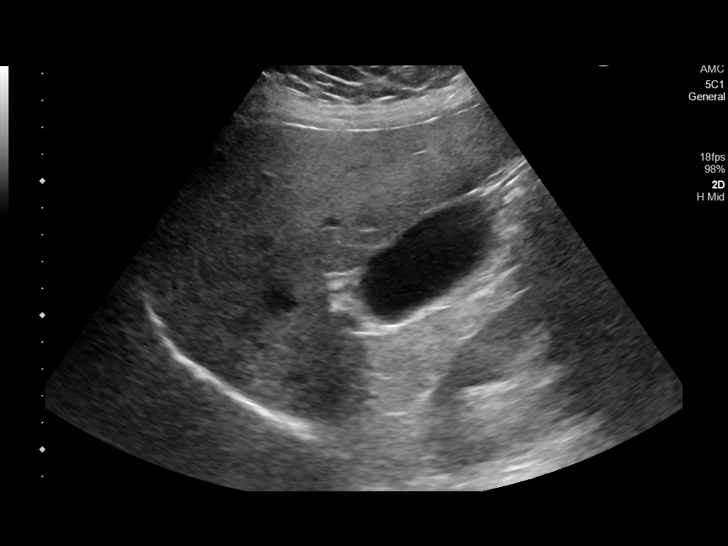
[im 36/40]
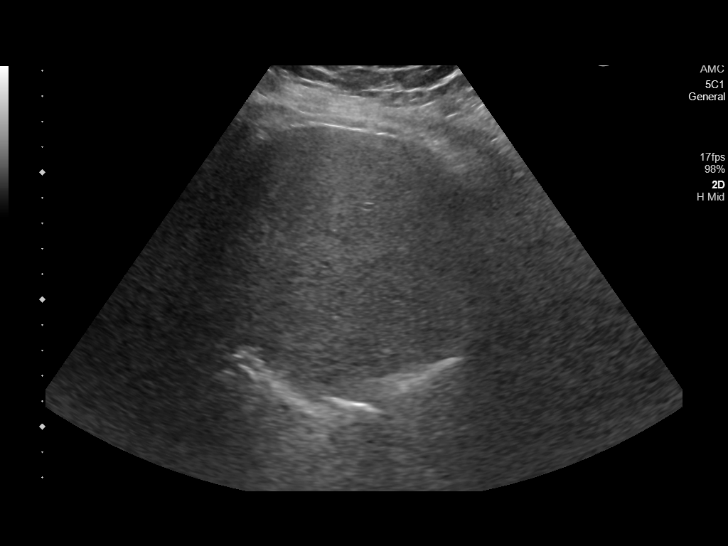
[im 40/40]
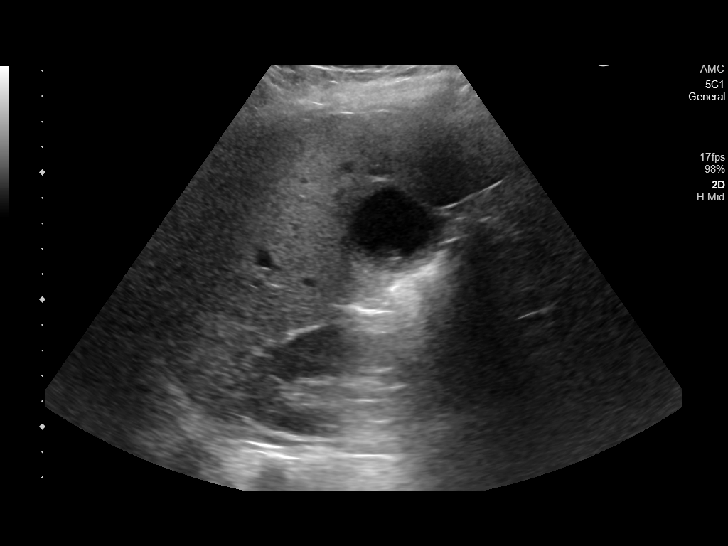

[14 of 25 positions shown; findings below may reference images not displayed]

FINDINGS: Gallbladder:

Distended gallbladder with small gallstones. Upper normal wall
thickness at 2.9 mm. No pericholecystic fluid. No sonographic Murphy
sign noted by sonographer.

Common bile duct:

Diameter: 6 mm, normal.

Liver:

Heterogeneously increased in parenchymal echogenicity. Focal fatty
sparing adjacent the gallbladder fossa. Portal vein is patent on
color Doppler imaging with normal direction of blood flow towards
the liver.

Other: No right upper quadrant free fluid.
IMPRESSION: 1. Gallstones with upper normal gallbladder wall thickness, but no
additional findings of acute cholecystitis.
2. No biliary dilatation.
3. Hepatic steatosis.

## 2022-01-25 ENCOUNTER — Emergency Department (HOSPITAL_COMMUNITY)
Admission: EM | Admit: 2022-01-25 | Discharge: 2022-01-25 | Disposition: A | Discharge status: Home / Self Care | Payer: Medicaid Other | Attending: Student | Admitting: Student

## 2022-01-25 ENCOUNTER — Emergency Department (HOSPITAL_COMMUNITY): Payer: Medicaid Other

## 2022-01-25 ENCOUNTER — Other Ambulatory Visit: Payer: Self-pay

## 2022-01-25 ENCOUNTER — Encounter (HOSPITAL_COMMUNITY): Payer: Self-pay | Admitting: Pharmacy Technician

## 2022-01-25 ENCOUNTER — Ambulatory Visit
Admission: EM | Admit: 2022-01-25 | Discharge: 2022-01-25 | Disposition: A | Discharge status: Home / Self Care | Payer: Medicaid Other

## 2022-01-25 DIAGNOSIS — R29810 Facial weakness: Secondary | ICD-10-CM | POA: Insufficient documentation

## 2022-01-25 DIAGNOSIS — Z5321 Procedure and treatment not carried out due to patient leaving prior to being seen by health care provider: Secondary | ICD-10-CM | POA: Insufficient documentation

## 2022-01-25 DIAGNOSIS — E119 Type 2 diabetes mellitus without complications: Secondary | ICD-10-CM | POA: Insufficient documentation

## 2022-01-25 LAB — CBC WITH DIFFERENTIAL/PLATELET
Abs Immature Granulocytes: 0.01 10*3/uL (ref 0.00–0.07)
Basophils Absolute: 0 10*3/uL (ref 0.0–0.1)
Basophils Relative: 1 %
Eosinophils Absolute: 0.1 10*3/uL (ref 0.0–0.5)
Eosinophils Relative: 2 %
HCT: 43.9 % (ref 36.0–46.0)
Hemoglobin: 15 g/dL (ref 12.0–15.0)
Immature Granulocytes: 0 %
Lymphocytes Relative: 37 %
Lymphs Abs: 2.2 10*3/uL (ref 0.7–4.0)
MCH: 31.3 pg (ref 26.0–34.0)
MCHC: 34.2 g/dL (ref 30.0–36.0)
MCV: 91.5 fL (ref 80.0–100.0)
Monocytes Absolute: 0.3 10*3/uL (ref 0.1–1.0)
Monocytes Relative: 5 %
Neutro Abs: 3.4 10*3/uL (ref 1.7–7.7)
Neutrophils Relative %: 55 %
Platelets: 241 10*3/uL (ref 150–400)
RBC: 4.8 MIL/uL (ref 3.87–5.11)
RDW: 11.9 % (ref 11.5–15.5)
WBC: 6 10*3/uL (ref 4.0–10.5)
nRBC: 0 % (ref 0.0–0.2)

## 2022-01-25 LAB — BASIC METABOLIC PANEL
Anion gap: 9 (ref 5–15)
BUN: 13 mg/dL (ref 6–20)
CO2: 23 mmol/L (ref 22–32)
Calcium: 9.1 mg/dL (ref 8.9–10.3)
Chloride: 103 mmol/L (ref 98–111)
Creatinine, Ser: 0.5 mg/dL (ref 0.44–1.00)
GFR, Estimated: >60 mL/min (ref >60)
Glucose, Bld: 346 mg/dL — ABNORMAL HIGH (ref 70–99)
Potassium: 4 mmol/L (ref 3.5–5.1)
Sodium: 135 mmol/L (ref 135–145)

## 2022-01-25 NOTE — ED Provider Notes (Signed)
?[facility] ? ? ? ?CSN: [id number] ?Arrival date & time: 01/25/22  1418 ? ? ?  ? ?History   ?Chief Complaint ?Chief Complaint  ?Patient presents with  ? Headache  ? ? ?HPI ?Kayla Marks is a 61 y.o. female presenting following headache and facial droop.  History of Bell's palsy.  Describes severe 8/10 right-sided headache with pain behind the right eye.  Symptoms for 4 days.  Also with facial droop at onset of symptoms.  States that the headache continues, but denies dizziness, vision changes, chest pain, shortness of breath.  States that she assumed it was her normal Bell's palsy. ? ?HPI ? ?Past Medical History:  ?Diagnosis Date  ? Bell's palsy   ? history, approx 2011  ? Diabetes mellitus   ? metformin  ? Obesity   ? Palpitations   ? Retinopathy   ? Right eye symptoms   ? "nerve issue"  ? Weakness   ? ? ?Patient Active Problem List  ? Diagnosis Date Noted  ? Eye abnormalities 12/08/2021  ? Family history of heart disease in brother 09/07/2021  ? Diabetes mellitus without complication ([place]) 22/97/9892  ? Type 2 diabetes mellitus with hyperglycemia, without long-term current use of insulin ([place]) 07/01/2021  ? Spinal stenosis of lumbar region 07/01/2021  ? General unsteadiness 07/01/2021  ? Palpitations 07/01/2021  ? Calculus of gallbladder without cholecystitis without obstruction 11/20/2020  ? HYPERLIPIDEMIA-MIXED 10/09/2010  ? Overweight 10/09/2010  ? ABNORMAL EKG 10/09/2010  ? ? ?Past Surgical History:  ?Procedure Laterality Date  ? ABDOMINAL HYSTERECTOMY  1996  ? [place]  ? CHOLECYSTECTOMY N/A 11/26/2020  ? Procedure: LAPAROSCOPIC CHOLECYSTECTOMY;  Surgeon: Virl Cagey, MD;  Location: [facility];  Service: General;  Laterality: N/A;  ? GALLBLADDER SURGERY    ? February 2022 possibly?  ? ? ?OB History   ? ? Gravida  ?3  ? Para  ?3  ? Term  ?0  ? Preterm  ?0  ? AB  ?0  ? Living  ?   ?  ? ? SAB  ?0  ? IAB  ?0  ? Ectopic  ?0  ? Multiple  ?   ? Live Births  ?   ?   ?  ?  ? ? ? ?Home  Medications   ? ?Prior to Admission medications   ?Medication Sig Start Date End Date Taking? Authorizing Provider  ?Dulaglutide (TRULICITY) 1.5 JJ/9.4RD SOPN Inject 1.5 mg into the skin once a week. 07/01/21   Cletis Athens, MD  ?gabapentin (NEURONTIN) 100 MG capsule Take 1 capsule (100 mg total) by mouth 3 (three) times daily. 07/01/21   Cletis Athens, MD  ?metFORMIN (GLUCOPHAGE) 1000 MG tablet Take 1 tablet (1,000 mg total) by mouth in the morning and at bedtime. 08/19/21   Cletis Athens, MD  ?traMADol (ULTRAM) 50 MG tablet Take 1 tablet (50 mg total) by mouth 2 (two) times daily as needed. 09/27/21   Volney American, PA-C  ? ? ?Family History ?Family History  ?Problem Relation Age of Onset  ? Other Father   ?     complications of CVA  ? Other Sister   ?     multiple myeloma  ? Coronary artery disease Brother   ? Diabetes Brother   ? ? ?Social History ?Social History  ? ?Tobacco Use  ? Smoking status: Never  ? Smokeless tobacco: Never  ?Vaping Use  ? Vaping Use: Never used  ?Substance Use Topics  ?  Alcohol use: No  ? Drug use: No  ? ? ? ?Allergies   ?Insulins, Penicillin g, and Penicillins ? ? ?Review of Systems ?Review of Systems  ?Neurological:  Positive for facial asymmetry.  ?All other systems reviewed and are negative. ? ? ?Physical Exam ?Triage Vital Signs ?ED Triage Vitals  ?Enc Vitals Group  ?   BP 01/25/22 1515 127/81  ?   Pulse Rate 01/25/22 1515 76  ?   Resp 01/25/22 1515 20  ?   Temp 01/25/22 1515 98 ?F (36.7 ?C)  ?   Temp src --   ?   SpO2 01/25/22 1515 96 %  ?   Weight --   ?   Height --   ?   Head Circumference --   ?   Peak Flow --   ?   Pain Score 01/25/22 1512 8  ?   Pain Loc --   ?   Pain Edu? --   ?   Excl. in [place]? --   ? ?No data found. ? ?Updated Vital Signs ?BP 127/81   Pulse 76   Temp 98 ?F (36.7 ?C)   Resp 20   SpO2 96%  ? ?Visual Acuity ?Right Eye Distance:   ?Left Eye Distance:   ?Bilateral Distance:   ? ?Right Eye Near:   ?Left Eye Near:    ?Bilateral Near:    ? ?Physical  Exam ?Vitals reviewed.  ?Constitutional:   ?   General: She is not in acute distress. ?   Appearance: Normal appearance. She is not ill-appearing.  ?HENT:  ?   Head: Normocephalic and atraumatic.  ?Pulmonary:  ?   Effort: Pulmonary effort is normal.  ?Neurological:  ?   General: No focal deficit present.  ?   Mental Status: She is alert and oriented to person, place, and time.  ?   Comments: Deficits in CN 7. CN 2-12 otherwise grossly intact. PERRLA, EOMI negative rhomberg, pronator drift, fingers to thumb. Gait intact.   ?Psychiatric:     ?   Mood and Affect: Mood normal.     ?   Behavior: Behavior normal.     ?   Thought Content: Thought content normal.     ?   Judgment: Judgment normal.  ? ? ? ?UC Treatments / Results  ?Labs ?(all labs ordered are listed, but only abnormal results are displayed) ?Labs Reviewed - No data to display ? ?EKG ? ? ?Radiology ?No results found. ? ?Procedures ?Procedures (including critical care time) ? ?Medications Ordered in UC ?Medications - No data to display ? ?Initial Impression / Assessment and Plan / UC Course  ?I have reviewed the triage vital signs and the nursing notes. ? ?Pertinent labs & imaging results that were available during my care of the patient were reviewed by me and considered in my medical decision making (see chart for details). ? ?  ? ?This patient is a very pleasant 61 y.o. year old female presenting with severe headache and deficits in CN VII. Symptoms x4 days. She does have a history of Bell's Palsy. Given severe headache and significant facial droop, sent to ED. Given symptoms x4 days, she declines transport via EMS, which I am in agreement with; sent to ED via personal vehicle driven by daughter.  ? ?Final Clinical Impressions(s) / UC Diagnoses  ? ?Final diagnoses:  ?None  ? ?Discharge Instructions   ?None ?  ? ?ED Prescriptions   ?None ?  ? ?PDMP not reviewed this  encounter. ?  ?Hazel Sams, PA-C ?01/25/22 1614 ? ?

## 2022-01-25 NOTE — ED Triage Notes (Signed)
Upon initial assessment pt has left side facial drooping and is unable to smile, provider made aware  ?

## 2022-01-25 NOTE — ED Notes (Signed)
Pt leaving with husband and stated that she no longer wanted to wait.  ?

## 2022-01-25 NOTE — ED Triage Notes (Signed)
Pt presents with c/o right side headache and states pain is behind left eye, that began on Saturday  ?

## 2022-01-25 NOTE — Discharge Instructions (Addendum)
Sent to ED via personal vehicle driven by daughter  ?

## 2022-01-25 NOTE — ED Triage Notes (Signed)
Patient is being discharged from the Urgent Care and sent to the Emergency Department via private vehicle . Per Olene Craven , patient is in need of higher level of care due to possible CVA.  PT request to go to [facility] hospital advised EMS would go to closest hospital, PT will be driven by daughter  ?Vitals:  ? 01/25/22 1515  ?BP: 127/81  ?Pulse: 76  ?Resp: 20  ?Temp: 98 ?F (36.7 ?C)  ?SpO2: 96%  ?  ?

## 2022-01-25 NOTE — ED Provider Triage Note (Signed)
Emergency Medicine Provider Triage Evaluation Note ? ?Kayla Marks , a 61 y.o. female  was evaluated in triage.  Pt complains of entire left side facial drooping x 4 days. Patient has a history of Bell's palsy in 2011. Patient has a history of diabetes.  Patient was evaluated urgent care today and sent to the ED for her symptoms.  Denies unilateral weakness, numbness.  ? ? ?Per patient chart review: Patient was evaluated urgent care today for similar concerns.  Patient had a no focal neurological deficits however noted deficit with cranial nerve VII.  Patient was sent to the ED for further evaluation of her symptoms. ? ?Review of Systems  ?Positive: As per HPI above ?Negative:  ? ?Physical Exam  ?BP 127/80 (BP Location: Right Arm)   Pulse 76   Temp (!) 97.3 ?F (36.3 ?C) (Oral)   Resp 16   SpO2 99%  ?Gen:   Awake, no distress   ?Resp:  Normal effort  ?MSK:   Moves extremities without difficulty  ?Other:  Facial drooping noted to entire left side of face. CN 7 deficit, no other cranial nerve deficits noted on exam.  Negative pronator drift.  TMs without abnormalities noted bilaterally. ? ?Medical Decision Making  ?Medically screening exam initiated at 5:18 PM.  Appropriate orders placed.  Jonette Mate D Westerfeld was informed that the remainder of the evaluation will be completed by another provider, this initial triage assessment does not replace that evaluation, and the importance of remaining in the ED until their evaluation is complete. ?  ?Avyukt Cimo A, PA-C ?01/25/22 1721 ? ?

## 2022-01-25 NOTE — ED Triage Notes (Signed)
Pt here with reports of L sided facial droop, and pain behind R eye. Pt reports this has been going on for days.denies recent sickness. Pt with no unilateral weakness. Pt also reports her tongue is numb and she can not taste.  ?

## 2022-01-26 ENCOUNTER — Emergency Department (HOSPITAL_COMMUNITY): Payer: Self-pay

## 2022-01-26 ENCOUNTER — Emergency Department (HOSPITAL_BASED_OUTPATIENT_CLINIC_OR_DEPARTMENT_OTHER)
Admission: EM | Admit: 2022-01-26 | Discharge: 2022-01-26 | Disposition: A | Discharge status: Home / Self Care | Payer: Self-pay | Attending: Emergency Medicine | Admitting: Emergency Medicine

## 2022-01-26 DIAGNOSIS — Z794 Long term (current) use of insulin: Secondary | ICD-10-CM | POA: Insufficient documentation

## 2022-01-26 DIAGNOSIS — E119 Type 2 diabetes mellitus without complications: Secondary | ICD-10-CM | POA: Insufficient documentation

## 2022-01-26 DIAGNOSIS — Z7984 Long term (current) use of oral hypoglycemic drugs: Secondary | ICD-10-CM | POA: Insufficient documentation

## 2022-01-26 DIAGNOSIS — R519 Headache, unspecified: Secondary | ICD-10-CM | POA: Insufficient documentation

## 2022-01-26 DIAGNOSIS — R29810 Facial weakness: Secondary | ICD-10-CM | POA: Insufficient documentation

## 2022-01-26 DIAGNOSIS — R200 Anesthesia of skin: Secondary | ICD-10-CM | POA: Insufficient documentation

## 2022-01-26 DIAGNOSIS — G510 Bell's palsy: Secondary | ICD-10-CM

## 2022-01-26 LAB — CBG MONITORING, ED: Glucose-Capillary: 371 mg/dL — ABNORMAL HIGH (ref 70–99)

## 2022-01-26 MED ORDER — PREDNISONE 20 MG PO TABS: 40.0000 mg | ORAL_TABLET | Freq: Every day | ORAL | 0 refills | Status: AC

## 2022-01-26 MED ORDER — VALACYCLOVIR HCL 1 G PO TABS: 1000.0000 mg | ORAL_TABLET | Freq: Three times a day (TID) | ORAL | 0 refills | Status: AC

## 2022-01-26 MED ORDER — GADOBUTROL 1 MMOL/ML IV SOLN
6.5000 mL | Freq: Once | INTRAVENOUS | Status: AC | PRN
  Administered 2022-01-26: 6.5 mL via INTRAVENOUS

## 2022-01-26 NOTE — ED Notes (Signed)
Patient transported to MRI 

## 2022-01-26 NOTE — ED Provider Notes (Signed)
Update note ? ?61 year old lady presenting to ER with concern for facial droop as well as tingling/numb sensation in the left arm.  Exam was most consistent with Bell's palsy but given the ER complaint, she was sent to ER for MRI To rule out stroke. ? ?MRI has been obtained and is negative for acute stroke or other acute pathology.  Personally completed a neurologic exam and she does have left facial droop with forehead involvement.  Her sensation to light touch is intact in both upper upper and lower extremities.  She has a completely normal strength exam in her extremities.  Feel this presentation is most likely consistent with Bell's palsy, particularly given the negative MRI imaging.  Recommend she complete course of steroids, antiviral and follow-up with neurology and ophthalmology as an outpatient.  I discussed importance of eye care, eyedrops and ointment.  Updated patient's husband at bedside as well. ? ? ?  ?Milagros Loll, MD ?01/27/22 [phone] ? ?

## 2022-01-26 NOTE — ED Notes (Signed)
Pt (through daughter interpreting) stated that her symptoms started on Fri and has been progressively getting worse. Facial Droop, arm sensation abnormal and a headache. Everything started Fri. 10-15 years ago had Bells Palsy.  ?

## 2022-01-26 NOTE — ED Triage Notes (Signed)
Patient arrives with complaints of left-sided facial droop, left arm numbness, and headache and weakness x4 days. Patient was seen at [facility] for the same yesterday, but left prior to full treatment due to the wait time.  ? ?Patient has a history of Bell's Palsy, but wanted to rule out stroke. Rates pain a 8/10 (pain behind right eye_.  ? ?Patient speaks primarily [place], family at bedside interpreting for patient.  ?

## 2022-01-26 NOTE — ED Provider Notes (Signed)
?[facility] ?Provider Note ? ? ?CSN: [id number] ?Arrival date & time: 01/26/22  1123 ? ?  ? ?History ? ?Chief Complaint  ?Patient presents with  ? Facial Droop  ?  X4 days  ? Headache  ? ? ?Kayla Marks is a 61 y.o. female. ? ? ?Headache ?Associated symptoms: numbness   ? ?61 year old female with medical history significant for obesity, diabetes mellitus type 2, Bell's palsy who presents to the emergency department with 4 days of a facial droop.  The patient states that she has had multiple symptoms in her face to include left-sided facial droop, headache, focused around her right eye, some blurry vision in her left eye.  She additionally endorses numbness down her left arm with decreased sensation throughout the arm compared to the right.  The symptoms began on Friday and have been progressively worsening.  The facial droop does involve her forehead slightly but she has had never had numbness with Bell's palsy symptoms before.  She endorses a headache.  Headache symptoms are moderate to severe in intensity.  She denies any chest pain or shortness of breath.  She had initially assumed it was Bell's palsy. ? ?She was seen at urgent care yesterday and was told to go to the emergency department for advanced imaging to rule out stroke.  She had a CT of the head last night that was negative for acute abnormalities.  She was waiting to be seen by provider and left without being seen after initial triage imaging due to long wait times. ? ?Home Medications ?Prior to Admission medications   ?Medication Sig Start Date End Date Taking? Authorizing Provider  ?Dulaglutide (TRULICITY) 1.5 MG/0.5ML SOPN Inject 1.5 mg into the skin once a week. 07/01/21  Yes Corky Downs, MD  ?metFORMIN (GLUCOPHAGE) 1000 MG tablet Take 1 tablet (1,000 mg total) by mouth in the morning and at bedtime. 08/19/21  Yes Masoud, Renda Rolls, MD  ?gabapentin (NEURONTIN) 100 MG capsule Take 1 capsule (100 mg total) by mouth 3 (three)  times daily. ?Patient not taking: Reported on 01/26/2022 07/01/21   Corky Downs, MD  ?traMADol (ULTRAM) 50 MG tablet Take 1 tablet (50 mg total) by mouth 2 (two) times daily as needed. ?Patient not taking: Reported on 01/26/2022 09/27/21   Particia Nearing, PA-C  ?   ? ?Allergies    ?Penicillin g and Penicillins   ? ?Review of Systems   ?Review of Systems  ?Neurological:  Positive for facial asymmetry, numbness and headaches.  ?All other systems reviewed and are negative. ? ?Physical Exam ?Updated Vital Signs ?BP 127/70   Pulse 70   Temp 98.1 ?F (36.7 ?C) (Oral)   Resp 18   Ht 4\' 8"  (1.422 m)   Wt 65.5 kg   SpO2 98%   BMI 32.37 kg/m?  ?Physical Exam ?Vitals and nursing note reviewed.  ?Constitutional:   ?   General: She is not in acute distress. ?   Appearance: She is well-developed.  ?HENT:  ?   Head: Normocephalic and atraumatic.  ?   Right Ear: Hearing, tympanic membrane, ear canal and external ear normal.  ?   Left Ear: Hearing, tympanic membrane, ear canal and external ear normal.  ?Eyes:  ?   General: Vision grossly intact.  ?   Conjunctiva/sclera: Conjunctivae normal.  ?Cardiovascular:  ?   Rate and Rhythm: Normal rate and regular rhythm.  ?   Heart sounds: No murmur heard. ?Pulmonary:  ?   Effort: Pulmonary effort is  normal. No respiratory distress.  ?   Breath sounds: Normal breath sounds.  ?Abdominal:  ?   Palpations: Abdomen is soft.  ?   Tenderness: There is no abdominal tenderness.  ?Musculoskeletal:     ?   General: No swelling.  ?   Cervical back: Neck supple.  ?Skin: ?   General: Skin is warm and dry.  ?   Capillary Refill: Capillary refill takes less than 2 seconds.  ?Neurological:  ?   Mental Status: She is alert and oriented to person, place, and time.  ?   GCS: GCS eye subscore is 4. GCS verbal subscore is 5. GCS motor subscore is 6.  ?   Comments: MENTAL STATUS EXAM:    ?Orientation: Alert and oriented to person, place and time.  ?Memory: Cooperative, follows commands well.   ?Language: Speech is clear and language is normal.  ? ?CRANIAL NERVES:    ?CN 2 (Optic): Visual fields intact to confrontation.  ?CN 3,4,6 (EOM): Pupils equal and reactive to light. Full extraocular eye movement without nystagmus.  ?CN 5 (Trigeminal): Facial sensation is diminished on the right compared to left. ?CN 7 (Facial): left facial droop present, does appear to involve the forehead slightly with decreased ability to raise the left eyebrow. ?CN 8 (Auditory): Auditory acuity grossly normal.  ?CN 9,10 (Glossophar): The uvula is midline, the palate elevates symmetrically.  ?CN 11 (spinal access): Normal sternocleidomastoid and trapezius strength.  ?CN 12 (Hypoglossal): The tongue is midline. No atrophy or fasciculations..  ? ?MOTOR:  Muscle Strength: 5/5RUE, 5/5LUE, 5/5RLE, 5/5LLE.  ? ?COORDINATION:   No tremor.  ? ?SENSATION:   Decreased sensation to light touch in the left upper extremity compared to the right.  No other sensory deficit. ? ?GAIT: Gait normal without ataxia ?  ?Psychiatric:     ?   Mood and Affect: Mood normal.  ? ? ?ED Results / Procedures / Treatments   ?Labs ?(all labs ordered are listed, but only abnormal results are displayed) ?Labs Reviewed  ?CBG MONITORING, ED - Abnormal; Notable for the following components:  ?    Result Value  ? Glucose-Capillary 371 (*)   ? All other components within normal limits  ?RESP PANEL BY RT-PCR (FLU A&B, COVID) ARPGX2  ? ? ?EKG ?EKG Interpretation ? ?Date/Time:  Tuesday January 26 2022 11:31:14 EDT ?Ventricular Rate:  81 ?PR Interval:  175 ?QRS Duration: 77 ?QT Interval:  388 ?QTC Calculation: 451 ?R Axis:   59 ?Text Interpretation: Sinus rhythm Anterior infarct, old Confirmed by Ernie Avena ([id number]) on 01/26/2022 11:32:58 AM ? ?Radiology ?CT Head Wo Contrast ? ?Result Date: 01/25/2022 ?CLINICAL DATA:  Facial paralysis/weakness (CN 7) facial droop. EXAM: CT HEAD WITHOUT CONTRAST TECHNIQUE: Contiguous axial images were obtained from the base of the skull through  the vertex without intravenous contrast. RADIATION DOSE REDUCTION: This exam was performed according to the departmental dose-optimization program which includes automated exposure control, adjustment of the mA and/or kV according to patient size and/or use of iterative reconstruction technique. COMPARISON:  MRI 10/08/2014 FINDINGS: Brain: Normal anatomic configuration. No abnormal intra or extra-axial mass lesion or fluid collection. No abnormal mass effect or midline shift. No evidence of acute intracranial hemorrhage or infarct. Ventricular size is normal. Cerebellum unremarkable. Vascular: Unremarkable Skull: Intact Sinuses/Orbits: Paranasal sinuses are clear. Orbits are unremarkable. Other: Mastoid air cells and middle ear cavities are clear. IMPRESSION: No acute intracranial abnormality. Electronically Signed   By: Helyn Numbers M.D.   On: 01/25/2022 19:36   ? ?  Procedures ?Procedures  ? ? ?Medications Ordered in ED ?Medications - No data to display ? ?ED Course/ Medical Decision Making/ A&P ?  ?                        ?Medical Decision Making ?Amount and/or Complexity of Data Reviewed ?Radiology: ordered. ? ? ?61 year old female with medical history significant for obesity, diabetes mellitus type 2, Bell's palsy who presents to the emergency department with 4 days of a facial droop.  The patient states that she has had multiple symptoms in her face to include left-sided facial droop, headache, focused around her right eye, some blurry vision in her left eye.  She additionally endorses numbness down her left arm with decreased sensation throughout the arm compared to the right.  The symptoms began on Friday and have been progressively worsening.  The facial droop does involve her forehead slightly but she has had never had numbness with Bell's palsy symptoms before.  She endorses a headache.  Headache symptoms are moderate to severe in intensity.  She denies any chest pain or shortness of breath.  She had  initially assumed it was Bell's palsy. ? ?She was seen at urgent care yesterday and was told to go to the emergency department for advanced imaging to rule out stroke.  She had a CT of the head last night that was negati

## 2022-01-26 NOTE — ED Notes (Signed)
Pt is in MRI  

## 2022-01-26 NOTE — Discharge Instructions (Addendum)
We suspect you most likely have Bell's Palsy.  Please take the antiviral medicine as prescribed.  Additionally take the steroids as prescribed.  Additionally, during the day you should frequently use artificial tears every hour for your affected eye and use over-the-counter eye ointment for your eyes at night to keep them lubricated. ? ?For follow-up, recommend following up with both neurologist and an ophthalmologist. ? ?Please come back to ER if you develop numbness, weakness in your arms, legs, vision change, or other new concerning symptom. ?

## 2022-01-26 NOTE — ED Notes (Signed)
This RN attempted to call both patient and daughter, both times went to voicemail  ?

## 2022-01-26 NOTE — ED Notes (Signed)
Pt was instructed to head directly over to [facility] for a MRI. Pt and family understood all transfer instructions and stated that they would be right over.  ?

## 2022-01-27 ENCOUNTER — Ambulatory Visit: Payer: Medicaid Other | Admitting: Internal Medicine

## 2022-02-02 ENCOUNTER — Ambulatory Visit: Payer: Medicaid Other | Admitting: Internal Medicine

## 2022-07-17 ENCOUNTER — Other Ambulatory Visit: Payer: Self-pay | Admitting: *Deleted

## 2022-07-17 DIAGNOSIS — Z1231 Encounter for screening mammogram for malignant neoplasm of breast: Secondary | ICD-10-CM

## 2022-10-14 DIAGNOSIS — E1159 Type 2 diabetes mellitus with other circulatory complications: Secondary | ICD-10-CM | POA: Diagnosis not present

## 2022-10-14 DIAGNOSIS — R300 Dysuria: Secondary | ICD-10-CM | POA: Diagnosis not present

## 2022-10-14 DIAGNOSIS — M545 Low back pain, unspecified: Secondary | ICD-10-CM | POA: Diagnosis not present

## 2022-10-14 DIAGNOSIS — E785 Hyperlipidemia, unspecified: Secondary | ICD-10-CM | POA: Diagnosis not present

## 2022-10-14 DIAGNOSIS — R8290 Unspecified abnormal findings in urine: Secondary | ICD-10-CM | POA: Diagnosis not present

## 2022-10-14 DIAGNOSIS — E1169 Type 2 diabetes mellitus with other specified complication: Secondary | ICD-10-CM | POA: Diagnosis not present

## 2022-10-14 DIAGNOSIS — E1165 Type 2 diabetes mellitus with hyperglycemia: Secondary | ICD-10-CM | POA: Diagnosis not present

## 2022-10-14 DIAGNOSIS — I152 Hypertension secondary to endocrine disorders: Secondary | ICD-10-CM | POA: Diagnosis not present

## 2022-11-02 DIAGNOSIS — H2513 Age-related nuclear cataract, bilateral: Secondary | ICD-10-CM | POA: Diagnosis not present

## 2023-01-06 ENCOUNTER — Encounter: Payer: Self-pay | Admitting: Radiology

## 2024-07-02 ENCOUNTER — Ambulatory Visit: Admitting: Nurse Practitioner

## 2024-10-01 ENCOUNTER — Telehealth: Payer: Self-pay

## 2024-10-01 ENCOUNTER — Ambulatory Visit
Admission: EM | Admit: 2024-10-01 | Discharge: 2024-10-01 | Disposition: A | Discharge status: Home / Self Care | Attending: Family Medicine | Admitting: Family Medicine

## 2024-10-01 DIAGNOSIS — H60391 Other infective otitis externa, right ear: Secondary | ICD-10-CM

## 2024-10-01 MED ORDER — AZITHROMYCIN 250 MG PO TABS: ORAL_TABLET | ORAL | 0 refills | Status: AC

## 2024-10-01 MED ORDER — AZITHROMYCIN 250 MG PO TABS: ORAL_TABLET | ORAL | 0 refills | Status: DC

## 2024-10-01 NOTE — Telephone Encounter (Signed)
 Pt has requested to change meds to [facility] because [facility] would;d not be able to fill the meds this evening, change has been made

## 2024-10-01 NOTE — ED Triage Notes (Signed)
 Pt reports right ear pain, and swelling x 1 mo headache.

## 2024-10-01 NOTE — ED Provider Notes (Signed)
 [facility]    CSN: [id number] Arrival date & time: 10/01/24  1718      History   Chief Complaint No chief complaint on file.   HPI Kayla Marks is a 63 y.o. female.   Medical interpreter utilized today to facilitate visit with patient's consent.  Patient is presenting today with 1 month history of progressively worsening right ear pain, swelling.  Denies fever, chills, bleeding, drainage, loss of hearing, injury to the area.  So far not trying anything over-the-counter for symptoms.    Past Medical History:  Diagnosis Date   Bell's palsy    history, approx 2011   Diabetes mellitus    metformin    Obesity    Palpitations    Retinopathy    Right eye symptoms    nerve issue   Weakness     Patient Active Problem List   Diagnosis Date Noted   Eye abnormalities 12/08/2021   Family history of heart disease in brother 09/07/2021   Diabetes mellitus without complication (HCC) 07/01/2021   Type 2 diabetes mellitus with hyperglycemia, without long-term current use of insulin (HCC) 07/01/2021   Spinal stenosis of lumbar region 07/01/2021   General unsteadiness 07/01/2021   Palpitations 07/01/2021   Calculus of gallbladder without cholecystitis without obstruction 11/20/2020   HYPERLIPIDEMIA-MIXED 10/09/2010   Overweight 10/09/2010   ABNORMAL EKG 10/09/2010    Past Surgical History:  Procedure Laterality Date   ABDOMINAL HYSTERECTOMY  1996   CESAREAN SECTION  1990   CHOLECYSTECTOMY N/A 11/26/2020   Procedure: LAPAROSCOPIC CHOLECYSTECTOMY;  Surgeon: Kallie Manuelita BROCKS, MD;  Location: [facility];  Service: General;  Laterality: N/A;   GALLBLADDER SURGERY     February 2022 possibly?    OB History     Gravida  3   Para  3   Term  0   Preterm  0   AB  0   Living         SAB  0   IAB  0   Ectopic  0   Multiple      Live Births               Home Medications    Prior to Admission medications   Medication Sig Start Date End Date  Taking? Authorizing Provider  azithromycin  (ZITHROMAX ) 250 MG tablet Take first 2 tablets together, then 1 every day until finished. 10/01/24  Yes Stuart Vernell Norris, PA-C  Dulaglutide  (TRULICITY ) 1.5 MG/0.5ML SOPN Inject 1.5 mg into the skin once a week. 07/01/21   Britta King, MD  gabapentin  (NEURONTIN ) 100 MG capsule Take 1 capsule (100 mg total) by mouth 3 (three) times daily. Patient not taking: Reported on 01/26/2022 07/01/21   Britta King, MD  metFORMIN  (GLUCOPHAGE ) 1000 MG tablet Take 1 tablet (1,000 mg total) by mouth in the morning and at bedtime. 08/19/21   Britta King, MD  traMADol  (ULTRAM ) 50 MG tablet Take 1 tablet (50 mg total) by mouth 2 (two) times daily as needed. Patient not taking: Reported on 01/26/2022 09/27/21   Stuart Vernell Norris, PA-C  valACYclovir  (VALTREX ) 1000 MG tablet Take 1 tablet (1,000 mg total) by mouth 3 (three) times daily. 01/26/22   Schuyler Charlie RAMAN, MD    Family History Family History  Problem Relation Age of Onset   Other Father        complications of CVA   Other Sister        multiple myeloma   Coronary artery disease Brother  Diabetes Brother     Social History Social History   Tobacco Use   Smoking status: Never   Smokeless tobacco: Never  Vaping Use   Vaping status: Never Used  Substance Use Topics   Alcohol use: No   Drug use: No     Allergies   Penicillin g and Penicillins   Review of Systems Review of Systems Per HPI  Physical Exam Triage Vital Signs ED Triage Vitals  Encounter Vitals Group     BP 10/01/24 1751 135/78     Girls Systolic BP Percentile --      Girls Diastolic BP Percentile --      Boys Systolic BP Percentile --      Boys Diastolic BP Percentile --      Pulse Rate 10/01/24 1751 75     Resp 10/01/24 1751 20     Temp 10/01/24 1751 97.9 F (36.6 C)     Temp Source 10/01/24 1751 Oral     SpO2 10/01/24 1751 94 %     Weight --      Height --      Head Circumference --      Peak Flow --       Pain Score 10/01/24 1753 9     Pain Loc --      Pain Education --      Exclude from Growth Chart --    No data found.  Updated Vital Signs BP 135/78 (BP Location: Right Arm)   Pulse 75   Temp 97.9 F (36.6 C) (Oral)   Resp 20   SpO2 94%   Visual Acuity Right Eye Distance:   Left Eye Distance:   Bilateral Distance:    Right Eye Near:   Left Eye Near:    Bilateral Near:     Physical Exam Vitals and nursing note reviewed.  Constitutional:      Appearance: Normal appearance. She is not ill-appearing.  HENT:     Head: Atraumatic.     Left Ear: Tympanic membrane, ear canal and external ear normal.     Ears:     Comments: Significant edema, tenderness to palpation to the right EAC.  Minimally able to visualize TM but benign-appearing from what I am able to visualize    Nose: Nose normal.     Mouth/Throat:     Mouth: Mucous membranes are moist.  Eyes:     Extraocular Movements: Extraocular movements intact.     Conjunctiva/sclera: Conjunctivae normal.  Cardiovascular:     Rate and Rhythm: Normal rate.  Pulmonary:     Effort: Pulmonary effort is normal.  Musculoskeletal:        General: Normal range of motion.     Cervical back: Normal range of motion and neck supple.  Skin:    General: Skin is warm and dry.  Neurological:     Mental Status: She is alert and oriented to person, place, and time.  Psychiatric:        Mood and Affect: Mood normal.        Thought Content: Thought content normal.        Judgment: Judgment normal.      UC Treatments / Results  Labs (all labs ordered are listed, but only abnormal results are displayed) Labs Reviewed - No data to display  EKG   Radiology No results found.  Procedures Procedures (including critical care time)  Medications Ordered in UC Medications - No data to display  Initial Impression /  Assessment and Plan / UC Course  I have reviewed the triage vital signs and the nursing notes.  Pertinent labs &  imaging results that were available during my care of the patient were reviewed by me and considered in my medical decision making (see chart for details).     Consistent with right otitis externa.  Given the extent of swelling, will treat with oral antibiotics.  Treat with azithromycin , warm compresses, over-the-counter pain relievers.  Return for worsening or unresolving symptoms.  Final Clinical Impressions(s) / UC Diagnoses   Final diagnoses:  Other infective acute otitis externa of right ear   Discharge Instructions   None    ED Prescriptions     Medication Sig Dispense Auth. Provider   azithromycin  (ZITHROMAX ) 250 MG tablet Take first 2 tablets together, then 1 every day until finished. 6 tablet Stuart Vernell Norris, [place]      PDMP not reviewed this encounter.   Stuart Vernell Norris, [place] 10/01/24 [phone]

## 2025-01-23 ENCOUNTER — Ambulatory Visit
